# Patient Record
Sex: Female | Born: 1950 | Race: White | Hispanic: No | Marital: Married | Smoking: Former smoker
Health system: Southern US, Community
[De-identification: ages and names within clinical notes are randomized; demographics above are authoritative.]

## PROBLEM LIST (undated history)

## (undated) DIAGNOSIS — M1990 Unspecified osteoarthritis, unspecified site: Secondary | ICD-10-CM

## (undated) DIAGNOSIS — K648 Other hemorrhoids: Secondary | ICD-10-CM

## (undated) DIAGNOSIS — G479 Sleep disorder, unspecified: Secondary | ICD-10-CM

## (undated) DIAGNOSIS — I10 Essential (primary) hypertension: Secondary | ICD-10-CM

## (undated) DIAGNOSIS — Z9889 Other specified postprocedural states: Secondary | ICD-10-CM

## (undated) DIAGNOSIS — E785 Hyperlipidemia, unspecified: Secondary | ICD-10-CM

## (undated) DIAGNOSIS — K589 Irritable bowel syndrome without diarrhea: Secondary | ICD-10-CM

## (undated) DIAGNOSIS — R203 Hyperesthesia: Secondary | ICD-10-CM

## (undated) DIAGNOSIS — T7840XA Allergy, unspecified, initial encounter: Secondary | ICD-10-CM

## (undated) DIAGNOSIS — K5790 Diverticulosis of intestine, part unspecified, without perforation or abscess without bleeding: Secondary | ICD-10-CM

## (undated) DIAGNOSIS — F329 Major depressive disorder, single episode, unspecified: Secondary | ICD-10-CM

## (undated) DIAGNOSIS — F32A Depression, unspecified: Secondary | ICD-10-CM

## (undated) DIAGNOSIS — K219 Gastro-esophageal reflux disease without esophagitis: Secondary | ICD-10-CM

## (undated) DIAGNOSIS — E039 Hypothyroidism, unspecified: Secondary | ICD-10-CM

## (undated) DIAGNOSIS — Z9109 Other allergy status, other than to drugs and biological substances: Secondary | ICD-10-CM

## (undated) DIAGNOSIS — L290 Pruritus ani: Secondary | ICD-10-CM

## (undated) DIAGNOSIS — R112 Nausea with vomiting, unspecified: Secondary | ICD-10-CM

## (undated) HISTORY — PX: TOTAL HIP ARTHROPLASTY: SHX124

## (undated) HISTORY — PX: POLYPECTOMY: SHX149

## (undated) HISTORY — DX: Pruritus ani: L29.0

## (undated) HISTORY — DX: Hyperlipidemia, unspecified: E78.5

## (undated) HISTORY — DX: Other hemorrhoids: K64.8

## (undated) HISTORY — DX: Diverticulosis of intestine, part unspecified, without perforation or abscess without bleeding: K57.90

## (undated) HISTORY — PX: COLONOSCOPY: SHX174

## (undated) HISTORY — DX: Unspecified osteoarthritis, unspecified site: M19.90

## (undated) HISTORY — DX: Allergy, unspecified, initial encounter: T78.40XA

---

## 1997-12-05 ENCOUNTER — Other Ambulatory Visit: Admission: RE | Admit: 1997-12-05 | Discharge: 1997-12-05 | Payer: Self-pay | Admitting: *Deleted

## 1999-01-14 ENCOUNTER — Other Ambulatory Visit: Admission: RE | Admit: 1999-01-14 | Discharge: 1999-01-14 | Payer: Self-pay | Admitting: *Deleted

## 1999-02-14 ENCOUNTER — Other Ambulatory Visit: Admission: RE | Admit: 1999-02-14 | Discharge: 1999-02-14 | Payer: Self-pay | Admitting: *Deleted

## 2000-01-07 ENCOUNTER — Other Ambulatory Visit: Admission: RE | Admit: 2000-01-07 | Discharge: 2000-01-07 | Payer: Self-pay | Admitting: *Deleted

## 2000-02-04 ENCOUNTER — Encounter: Admission: RE | Admit: 2000-02-04 | Discharge: 2000-02-04 | Payer: Self-pay | Admitting: Internal Medicine

## 2000-02-04 ENCOUNTER — Encounter: Payer: Self-pay | Admitting: Internal Medicine

## 2000-02-18 ENCOUNTER — Ambulatory Visit (HOSPITAL_COMMUNITY): Admission: RE | Admit: 2000-02-18 | Discharge: 2000-02-18 | Payer: Self-pay | Admitting: Gastroenterology

## 2000-02-18 ENCOUNTER — Encounter (INDEPENDENT_AMBULATORY_CARE_PROVIDER_SITE_OTHER): Payer: Self-pay | Admitting: *Deleted

## 2001-01-12 ENCOUNTER — Other Ambulatory Visit: Admission: RE | Admit: 2001-01-12 | Discharge: 2001-01-12 | Payer: Self-pay | Admitting: *Deleted

## 2005-04-30 ENCOUNTER — Ambulatory Visit (HOSPITAL_COMMUNITY): Admission: RE | Admit: 2005-04-30 | Discharge: 2005-04-30 | Payer: Self-pay | Admitting: Gastroenterology

## 2006-03-04 ENCOUNTER — Encounter: Admission: RE | Admit: 2006-03-04 | Discharge: 2006-03-04 | Payer: Self-pay | Admitting: Orthopedic Surgery

## 2006-08-11 HISTORY — PX: TOTAL HIP ARTHROPLASTY: SHX124

## 2006-08-13 ENCOUNTER — Inpatient Hospital Stay (HOSPITAL_COMMUNITY): Admission: RE | Admit: 2006-08-13 | Discharge: 2006-08-16 | Payer: Self-pay | Admitting: Orthopedic Surgery

## 2010-08-15 ENCOUNTER — Encounter
Admission: RE | Admit: 2010-08-15 | Discharge: 2010-08-15 | Payer: Self-pay | Source: Home / Self Care | Attending: Internal Medicine | Admitting: Internal Medicine

## 2010-11-10 HISTORY — PX: NASAL SEPTUM SURGERY: SHX37

## 2010-12-27 NOTE — Discharge Summary (Signed)
NAMEORNELLA, CODERRE NO.:  [id number]   MEDICAL RECORD NO.:  [id number]          PATIENT TYPE:  INP   LOCATION:  1503                         FACILITY:  [facility]   PHYSICIAN:  Madlyn Frankel. Charlann Boxer, M.D.  DATE OF BIRTH:  [birth year]   DATE OF ADMISSION:  08/13/2006  DATE OF DISCHARGE:  08/16/2006                               DISCHARGE SUMMARY   ADMITTING DIAGNOSES:  1. Osteoarthritis.  2. Hypertension.  3. Hypothyroidism.  4. Irritable bowel syndrome.   DISCHARGE DIAGNOSIS:  1. Osteoarthritis.  2. Hypertension.  3. Hypothyroidism.  4. Irritable bowel syndrome.   CONSULTS:  None.   PROCEDURE:  Left total hip arthroplasty.  Surgeon was Dr. Durene Romans.  Assistant was [employer].  Components were a size 50 cup with the Trilock  size 10 with a size 45 be any femoral implant ESR and a 12/14 taper  sleeve on -1 adapter.   PREADMISSION LABS:  Preadmission CBC showed hemoglobin 13.4, hematocrit  38.0, tracked her course of stay on a daily basis starting on August 14, 2006 with hemoglobin of 10.5, hematocrit 30.2.  On August 15, 2006, his  white cell was 11.4, hemoglobin 10.6, hematocrit 29.6, hemodynamically  stable.  Preadmission white cell differential within normal limits.  Preadmission coagulation within normal limits.  Preadmission routine  chemistry showed sodium 137, potassium 33.9, glucose 109.  Tracked 2  times during the course of the day.  On August 14, 2006, sodium was 133,  potassium 3.5, glucose 121, BUN 5, creatinine 0.39.  On August 15, 2006,  his sodium was 133, potassium 3.4, glucose 134.  Preadmission UA showed  small nitrites, many leukocyte esterase and was treated preoperatively  for a UTI.   ELECTROCARDIOGRAM:  EKG showed normal sinus rhythm preoperatively,  clearance.   CHEST TWO VIEW:  No active cardiopulmonary disease.   PORTABLE PELVIS:  Portable pelvis postoperatively showed a status post  left total replacement without  complications.   HOSPITAL COURSE:  The patient underwent left total hip placement and  tolerated the procedure well.  He was admitted to the orthopedic floor.  Pain was well-controlled throughout the course of stay.  He remained  neuromuscularly and vascularly intact throughout his course of stay.  Hemovac was pulled on postoperative day #1.  The dressing was clean, dry  and intact throughout his course of stay.  Dressing was changed after 24  hours on a daily basis.  He was weightbearing as tolerated with the use  of a rolling walker.  Postoperative day #1, his Hemovac was pulled with  plans for DC on Sunday.  Home health or PT assessment determined he was  home health care PT.  [facility].  OT recommended a  brace, commode and 3-in-1 walker.  Postoperative day #2, he was doing  very well.  Hip incision was dry with dressing change.  Postoperative  day #3, again the patient doing well.  Wound was intact.  No sign of  ooze.  Neuromuscular vascularly intact.  He was to be discharged after  PT in improved condition.  DISCHARGED DISPOSITION:  Discharged home with home health care PT in  stable and improved condition.   DISCHARGE PHYSICAL THERAPY:  He is weightbearing as tolerated with the  use of a rolling walker.  We want to minimize pain, maximize range of  motion, increase muscle strength.  We want to encourage independence and  activities of daily living.  Work on gait retraining, as well as  proprioception.   DISCHARGE WOUND CARE:  The patient may shower by wrapping the incision.  She should keep it dry and redress it on a daily basis with a dry  dressing.   DISCHARGE MEDICATIONS:  1. Multiple supplements including flaxseed oil 1000 mg p.o. b.i.d.;      may restart after Lovenox and aspirin.  2. Glucosamine 1500 mg one p.o. three times daily; may start after      Lovenox and aspirin.  3. Chondroitin 1200 p.o. t.i.d.; same as glucosamine.  4. Calcium  vitamin D 600 mg one p.o. daily.  5. St. John's wort 300 mg one p.o. q.a.m.  6. Milk Thistle 175 mg one p.o. q.a.m.  7. Multivitamin one p.o. q.a.m.  8. Synthroid 200 mcg one p.o. q.a.m.  9. Toprol XL 100 mg one p.o. q.a.m.  10.Moexipril HCTZ 15/25 one p.o. q.a.m.  11.Zyrtec 5/120 mg one p.o. b.i.d.  12.Vytorin 10/40 mg one p.o. q.h.s.  13.Hyoscyamine 0.125 mg one p.o. q.4h. p.r.n. diarrhea.  14.Over-the-counter antioxidant one p.o. q.a.m.  15.Phazyme 180 mg one p.o. t.i.d.  16.Lovenox 40 mg one subcutaneous q.24h. for 11 additional days.  17.Robaxin 500 mg one to two p.o. q.4-6h. p.r.n. muscle spasm.  18.Darvocet-N 100 one to two p.o. q.4-6h. p.r.n. pain.  19.Enteric-coated aspirin 325 one p.o. daily x4 weeks after Lovenox      completed.  20.Colace 100 mg one p.o. b.i.d. p.r.n. constipation.   DISCHARGE FOLLOWUP:  Follow up with Dr. Charlann Boxer, [phone], in 2 weeks for a  wound check.  She may return to our office if her condition worsens.  If  she develops any acute shortness of breath or severe calf pain, please  call emergency service immediately.     ______________________________  Yetta Glassman [address], [place]      Madlyn Frankel. Charlann Boxer, M.D.  Electronically Signed    BLM/MEDQ  D:  09/17/2006  T:  09/17/2006  Job:  [id number]

## 2010-12-27 NOTE — H&P (Signed)
[name], [name] NO.:  [id number]   MEDICAL RECORD NO.:  [id number]           PATIENT TYPE:   LOCATION:                                 FACILITY:   PHYSICIAN:  [name]. [name], M.D.  DATE OF BIRTH:  [birth year]   DATE OF ADMISSION:  08/13/2006  DATE OF DISCHARGE:                              HISTORY & PHYSICAL   PROCEDURE:  Left total hip replacement.   CHIEF COMPLAINT:  Left hip pain.   HISTORY OF PRESENT ILLNESS:  This is a 60 year old female whose had  persistent left hip pain secondary to some OA degenerative changes.  All  conservative measures have failed including over-the-counter anti-  inflammatories, prescription anti-inflammatories and intra-articular  injections. Due to diminished quality of life, she has elected to  proceed forward with a total hip replacement.   PAST MEDICAL HISTORY:  1. Hypertension.  2. IBS.  3. Osteoarthritis.   PAST SURGICAL HISTORY:  None.   FAMILY HISTORY:  Hypertension, arthritis.   SOCIAL HISTORY:  The patient is single.  The primary caregiver will be a  friend.  She lives at a condominium that only has one floor and one  step.   DRUG ALLERGIES:  COMPAZINE, VIOXX, CELEBREX and VICODIN as VICODIN gives  her severe stomach pains.   MEDICATIONS:  1. Synthroid generic 200 mcg 1 p.o. daily.  2. Toprol XL 100 mg 1 p.o. daily.  3. Moexipril HCTZ 15/25 one p.o. daily.  4. Zyrtec D 5/120 mg 1 p.o. daily.  5. Vytorin 10/40 mg tabs 1 p.o. daily.  6. Hyoscyamine 0.125 mg 1 p.o. q.4h p.r.n. for diarrhea.   OVER-THE-COUNTER MEDICATIONS:  1. Flax seed oil.  2. Glucosamine chondroitin 1500/1200 mg.  3. Calcium 600 mg.  4. St. John's wort 300 mg.  5. Milk thistle.  6. Multivitamin.   The patient states that she can tolerate Darvon pain medicines as she  has taken those in the past.   REVIEW OF SYSTEMS:  In the past 2 weeks she has had no new signs or  symptoms of any cardiovascular, respiratory,  gastrointestinal,  genitourinary, neurologic and musculoskeletal system complaints.   PHYSICAL EXAM:  VITAL SIGNS:  Temperature 98.2, pulse 88, respirations  18, blood pressure 126/68.  GENERAL:  She is an awake, alert and oriented, well-developed, well-  nourished in no acute distress.  NECK:  No carotid bruits noted.  CHEST/LUNGS:  Clear to auscultation bilaterally.  BREASTS:  Deferred.  HEART:  Regular rate and rhythm without gallops, clicks, rubs or  murmurs.  ABDOMEN:  Soft, nontender, nondistended.  Bowel sounds x4.  GENITOURINARY:  Deferred.  EXTREMITIES:  Left leg is painful and has limited range of motion left  hip.  SKIN:  No signs of cellulitis.  Skin pink and warm.  NEUROLOGIC:  She has intact distal sensibilities.   LABS:  Pending her [name] presurgical appointment.   X-RAYS:  Chest x-ray are pending the [name] presurgical  appointment.  Pelvis x-rays were reviewed which show advanced  degenerative changes of her left hip.   IMPRESSION:  She has  left hip degenerative changes.   PLAN:  Left total hip replacement on August 13, 2006 by surgeon Dr.  Durene Mcknight.  The risks and complications were discussed.  Questions  were encouraged, answered and reviewed. We look forward to treating this  very active and motivated 60 year old female.     ______________________________  [name]. [name], [name]      [name]. [name], M.D.  Electronically Signed    BLM/MEDQ  D:  07/29/2006  T:  07/29/2006  Job:  [id number]

## 2010-12-27 NOTE — Procedures (Signed)
[facility]  Patient:    [name], [name]                       MRN: [id number] Proc. Date: 02/18/00 Adm. Date:  [id number] Attending:  Charna Elizabeth                           Procedure Report  DATE OF BIRTH:  1951/04/22.  REFERRING PHYSICIAN:  Warrick Parisian, M.D.  PROCEDURE PERFORMED:  Colonoscopy with biopsies.  ENDOSCOPIST:  Anselmo Rod, M.D.  INSTRUMENT USED:  Olympus video colonoscope.  INDICATIONS FOR PROCEDURE:  Rectal bleeding in a 60 year old white female rule out colonic polyps, masses, hemorrhoids, etc.  PREPROCEDURE PREPARATION:  Informed consent was procured from the patient. The patient was fasted for eight hours prior to the procedure and prepped with a bottle of magnesium citrate and a gallon of NuLytely the night prior to the procedure.  PREPROCEDURE PHYSICAL:  The patient had stable vital signs.  Neck supple. Chest clear to auscultation.  S1, S2 regular.  Abdomen soft with normal abdominal bowel sounds.  DESCRIPTION OF PROCEDURE:  The patient was placed in the left lateral decubitus position and sedated with 75 mg of Demerol and 7 mg of Versed intravenously.  Once the patient was adequately sedated and maintained on low-flow oxygen and continuous cardiac monitoring, the Olympus video colonoscope was advanced from the rectum to the cecum without difficulty. Except for small internal hemorrhoids on retroflexion in the rectum, no other abnormalities were seen in the colon.  No masses, polyps, erosions, ulcerations or diverticula were present.  There was mild erythematous change seen in the terminal ileum that was biopsied for pathology.  No frank ulceration was seen in the terminal ileum.  The patient tolerated the procedure well without complications.  The entire colonic mucosa in the cecum, right colon, transverse colon and left colon appeared normal.  IMPRESSION: 1. Small nonbleeding internal  hemorrhoid. 2. Mild erythematous changes in the terminal ileum, biopsied to rule out    ileitis. 3. No masses, polyps, erosions, ulcerations or diverticula were seen.  RECOMMENDATIONS: 1. Await pathology results. 2. Avoid nonsteroidals. 3. Outpatient follow-up in the next two weeks for further recommendations. DD:  02/18/00 TD:  02/19/00 Job: [id number]

## 2010-12-27 NOTE — Op Note (Signed)
NAMETORRIE, NAMBA                ACCOUNT NO.:  [id number]   MEDICAL RECORD NO.:  [id number]          PATIENT TYPE:  AMB   LOCATION:  ENDO                         FACILITY:  [facility]   PHYSICIAN:  Anselmo Rod, M.D.  DATE OF BIRTH:  1951/05/04   DATE OF PROCEDURE:  04/30/2005  DATE OF DISCHARGE:                                 OPERATIVE REPORT   PROCEDURE PERFORMED:  Screening colonoscopy.   ENDOSCOPIST:  Charna Elizabeth, M.D.   INSTRUMENT USED:  Olympus video colonoscope.   INDICATIONS FOR PROCEDURE:  A 60 year old white female with a history of  occasional bright red blood per rectum and irritable bowel syndrome  undergoing screening colonoscopy to rule out colonic polyps, masses, etc.   PREPROCEDURE PREPARATION:  Informed consent was procured from the patient.  The patient was fasted for eight hours prior to the procedure and prepped  with a bottle of magnesium citrate and a gallon of GoLYTELY the night prior  to the procedure.  The risks and benefits of the procedure including a 10%  miss rate for colon polyps or cancers was discussed with the patient as  well.   PREPROCEDURE PHYSICAL:  The patient had stable vital signs.  Neck supple.  Chest clear to auscultation.  S1 and S2 regular.  Abdomen soft with normal  bowel sounds.   DESCRIPTION OF PROCEDURE:  The patient was placed in the right lateral  decubitus position as she had some difficulty lying on her left hip.  Once  she was adequately sedated with 80 mg of Demerol and 6 mg of Versed in slow  incremental doses, the Olympus video colonoscope was advanced from the  rectum to the cecum.  The appendicular orifice and ileocecal valve were  clearly visualized and photographed.  A couple of diverticula were seen in  the left colon.  Small internal hemorrhoids were appreciated on retroflexion  in the rectum.  The rest of the exam was unremarkable.  There was some  residual stool in the colon and multiple washes were done.  The  patient  tolerated the procedure well without complication.   IMPRESSION:  1.  Small nonbleeding internal hemorrhoid.  2.  Couple of diverticula in the left colon.  3.  Normal-appearing transverse colon, right colon and cecum.   RECOMMENDATIONS:  1.  Continue high fiber diet with liberal fluid intake.  2.  Repeat colonoscopy is recommended in the next 10 years unless the      patient develops any abnormal symptoms in the interim.  3.  Anusol suppositories if needed for the hemorrhoids in the future.      Anselmo Rod, M.D.  Electronically Signed     JNM/MEDQ  D:  04/30/2005  T:  04/30/2005  Job:  [id number]   cc:   Merlene Laughter. Renae Gloss, M.D.  Fax: [phone]

## 2010-12-27 NOTE — Op Note (Signed)
NAMEDALLIS, DARDEN                ACCOUNT NO.:  [id number]   MEDICAL RECORD NO.:  [id number]          PATIENT TYPE:  INP   LOCATION:  0001                         FACILITY:  [facility] [place] [facility]   PHYSICIAN:  Madlyn Frankel. Charlann Boxer, M.D.  DATE OF BIRTH:  05-Aug-1951   DATE OF PROCEDURE:  08/13/2006  DATE OF DISCHARGE:                               OPERATIVE REPORT   PREOPERATIVE DIAGNOSIS:  Left hip end stage osteoarthritis.   POSTOPERATIVE DIAGNOSIS:  Left hip end stage osteoarthritis.   PROCEDURE PERFORMED:  Left total hip replacement.   COMPONENTS:  DePuy hip system with a size 50 ASR cup, a Trilock size 10  lateralized offset stem with a 45 ASR ball and -1 adapter.   SURGEON:  Durene Romans, M.D.   ASSISTANT:  Lenise Herald, PA-C.   ANESTHESIA:  Spinal plus MAC.   DRAINS:  One.   ESTIMATED BLOOD LOSS:  Less than 400 cc.   COMPLICATIONS:  None.   INDICATIONS FOR PROCEDURE:  The patient is a very pleasant 60 year old  female who presented to the office for persistent left hip pain.  She  had tried conservative measures which failed to provide adequate relief.  She is having decreased quality of life, unable to do the things she  wishes to do.  We thus discussed surgical options in terms of hip  replacement.  The risks and benefits of infection, DVT, dislocation,  component failure, and need for revision surgery were all reviewed.  I  also discussed bearing surfaces and the current technology and head  sizes available to prevent dislocation and provide increased longevity  of the components, and for metal on metal hip replacement.   PROCEDURE IN DETAIL:  The patient was brought to the operative theater.  Once adequate anesthesia and preoperative antibiotics, 2 gm of Ancef,  were administered, the patient was positioned in the right lateral  decubitus position with the left side up, which had been identified  preoperatively.  The left lower extremity was prescrubbed and prepped  and draped  in a sterile fashion.  A lateral-based incision was made for  posterior approach to the hip.  The patient was noted to have a very  tight anatomy.  Preoperatively, I identified the positions of her legs  in a neutral position on the table for comparison postop.   Examination prior to incision also noted very tight tissues,  particularly over the iliotibial band, even in the lateral position she  wanted to somewhat bounce with adduction indicating tight lateral  structures.   Exposure was obtained routinely with an incision through the iliotibial  band and gluteus fascia in line with the incision.  At this time, I also  noted the gluteus maximus to be pretty tight as well as the iliotibial  band.  No extra releases were carried out.  The short external rotators  were taken done, severed from the posterior capsule, and an L  capsulotomy was made.  The hip was dislocated.  The posterior leaf of  the capsule was saved for later anatomic repair as well as protection  against the sciatic nerve  from the retractors.   Following neck osteotomy, the center of the head was identified  visually.  I then made a measure of the cut of 37 mm down onto her  femoral neck to compensate for the lateralized offset component used.   At this point, attention was directed to the femur, retractors were  placed.  A box osteotome was used to set anteversion.  I then hand  reamed to open up the canal and then irrigated to prevent fat emboli.  I  then began broaching with a 6.3 and carried it all the way up to a size  10.   I noted preoperatively that her head center was above her trochanter on  both hips, and for that reason we made our neck cut accordingly and also  set our broaching to that as well.   Following this preparation of the femur, attention was directed to the  acetabulum, acetabular exposure was obtained including placement of  retractors.  She had a small acetabulum.  I began reaming with a  43  reamer down to the medial wall and carried this up to a 49 reamer.  At  2 I chose to do it by 1 mm increments.  This was needed due to the  fairly sclerotic subchondral bone.  I was able to get this penetrated.   Her posterior wall and column remained intact.  I identified the level  of reaming.  I then took the final 50 ASR cup and impacted it down to  the level of the reaming.  I was very happy with the purchase and  position of the cup which was about 35 to 40 degrees of abduction and 20  degrees of forward flexion.   Please note that there was noted to be 3 fairly large subchondral cysts  that were curetted out and then bone grafted.  This was done with  primary bone graft from the femoral head and neck.   Following this, attention was redirected back to the femur where I  impacted the size 10 broach.  I did not feel that it went all the way  down to my neck cut initially, so I went ahead and rebroached with 8 and  then countersunk the 10 down to the level of the neck cut.  I was happy  with this level.  A trial reduction was carried out with the lateralized  offset neck and -1 adapter.   At this point, trial reduction revealed that the hip was very stable  with a combined anteversion of 45 degrees.  There was 1 mm of shuck but  again noting the extensively tight tissues preoperatively this was okay.  With the legs in the neutral position again with the knees parallel to  one another, the leg lengths appeared to be equal, if anything a little  shorter on the left side.  She remained very tight with regard to the  lateral structures but was not very tight in extension.  Given this, the  final components were brought to the field.  The trial components were  thus removed and the final 10 lateralized offset stem was impacted down  to the level where the broach was.  I did go ahead and repeat my trial just to make sure and was happy with the -1 adapter and the 45 ASR ball.  This  was assembled on the back table and then impacted onto a clean and  dried trunion.  Visually, the head center was  above the trochanter as we  anticipated and thus corresponding to the leg length.  The hip was then  reduced, irrigated.  Hemostasis obtained as needed on the way up.  The  posterior capsule leaflet was reapproximated to the superior leaflet  using a #1 Ethibond.  The iliotibial band was reapproximated with #1  Ethilon.  At the apex of the incision, I did make a little bit of a  release into the iliotibial band to try to loosen it up a little bit.  The remainder of the gluteus fascia was  reapproximated using #1 Vicryl.  The remainder was closed with 2-0  Vicryl and a running 4-0 Monocryl in the skin.  The hip was then  cleaned, dried, and dressed sterilely with Steri-Strips, dressing  sponges, tape, and brought to the recovery room stable without  complication.      Madlyn Frankel Charlann Boxer, M.D.  Electronically Signed     MDO/MEDQ  D:  08/13/2006  T:  08/13/2006  Job:  [id number]

## 2011-01-09 ENCOUNTER — Other Ambulatory Visit: Payer: Self-pay | Admitting: Obstetrics and Gynecology

## 2011-07-31 ENCOUNTER — Encounter (HOSPITAL_COMMUNITY): Payer: Self-pay | Admitting: Pharmacy Technician

## 2011-08-06 NOTE — Progress Notes (Signed)
H&P dictated today for 08/18/2011 surgery. Dictation # [id number] 

## 2011-08-06 NOTE — H&P (Signed)
NAMELONISHA, BOBBY NO.:  [id number]  MEDICAL RECORD NO.:  [id number]  LOCATION:  PERIO                        FACILITY:  [facility]  PHYSICIAN:  Madlyn Frankel. Charlann Boxer, M.D.  DATE OF BIRTH:  [birth year]  DATE OF ADMISSION:  08/18/2011 DATE OF DISCHARGE:                             HISTORY & PHYSICAL   DATE OF SURGERY:  August 17, 2010.  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  Despite physical therapy and conservative measures, she continues to have pain in the hip and is now scheduled for revision of her acetabular component and ball and liner.  The surgery, risks, benefits, and aftercare were discussed in detail with the patient. Questions invited and answered.  Surgery to go ahead as scheduled.  Note that she does have a lot of problem with nausea and vomiting with codeine, oxycodone, hydrocodone.  So we will try with Dilaudid p.o. postoperatively and then maybe home with tramadol as she did well with Darvocet postop last time, but that is no longer available.  We may just use tramadol postoperatively for pain depending on her symptoms.  MEDICAL DOCTOR:  Merlene Laughter. Renae Gloss, M.D.  CARDIOLOGIST:  Richard A. Alanda Amass, M.D.  ENT:  Hermelinda Medicus, M.D.  She is given her home medicines of her Aspirin, Robaxin, iron, MiraLax, and Colace.  She is a candidate for tranexamic acid and will receive that preoperatively.  ALLERGIES: 1. Drug allergy to COMPAZINE, HYDROCODONE, and OXYCODONE. 2. 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  CURRENT MEDICATIONS: 1. L-thyroxine 150 mcg 1 daily. 2. Omeprazole 20 mg 1 daily. 3. Veramyst nasal spray 27.5 mcg b.i.d. 4. Moexipril hydrochlorothiazide 15/25 one-half tablet once a day. 5. Metoprolol ER 50 mg 1 tablet q.a.m., half  tablet q.p.m. 6. Crestor 20 mg nightly. 7. Fish oil. 8. Glucosamine. 9. Calcium. 10.Vitamin C. 11.CoQ10. 12.Milk thistle. 13.Vitamin D. 14.Also occasional Zyrtec-D b.i.d. p.r.n.  SERIOUS MEDICAL ILLNESSES:  Include: 1. Hypertension. 2. Hypothyroidism. 3. Reflux. 4. Irritable bowel syndrome. 5. Hyperlipidemia. 6. Allergies.  PREVIOUS SURGERIES:  Include total hip arthroplasty on the left and deviated septum surgery.  FAMILY HISTORY:  Positive for Parkinson's, pneumonia Alzheimer's, and breast cancer.  SOCIAL HISTORY:  The patient is single.  She lives at home.  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  She does not smoke and has 1 to 2 glasses of wine per day.  She is planning on going home after surgery.  REVIEW OF SYSTEMS:  CENTRAL NERVOUS SYSTEMS:  Negative for headache, blurred vision, or dizziness.  PULMONARY:  Negative for shortness of breath, PND, and orthopnea.  Positive for allergies.  CARDIOVASCULAR: Positive for hypertension, hyperlipidemia.  Negative for chest pain or palpitation.  GI:  Positive for reflux, irritable bowel syndrome, and history of hemorrhoids.  Negative for ulcers or hepatitis.  GU: Negative for urinary tract difficulty.  MUSCULOSKELETAL:  Positive in HPI.  PHYSICAL EXAMINATION:  VITAL SIGNS:  BP 128/80, pulse 77 and regular, respirations 16.  HEENT:  Head, normocephalic.  Nose patent.  Ears patent.  Pupils equal, round, reactive to light.  Throat without injection. NECK:  Supple without adenopathy.  Carotids 2+ without bruit. CHEST:  Clear to auscultation.  No rales or rhonchi.  Respirations 16. HEART:  Regular rate and rhythm at 77 beats per minute without murmur. ABDOMEN:  Soft.  Active bowel sounds.  No masses or organomegaly. NEUROLOGIC:  The patient is alert and oriented to time, place, and person.  Cranial nerves II through XII grossly intact. EXTREMITIES:  Shows the left hip with slightly decreased range of motion, with pain in  the anterior lateral aspect of the hip with all range of motion.  Neurovascular status intact.  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  PLAN:  Revision, acetabular component, ball and liner.     Jaquelyn Bitter. Rolla Kedzierski, P.A.   ______________________________ Madlyn Frankel Charlann Boxer, M.D.    SJC/MEDQ  D:  08/06/2011  T:  08/06/2011  Job:  [id number]

## 2011-08-07 ENCOUNTER — Encounter (HOSPITAL_COMMUNITY)
Admission: RE | Admit: 2011-08-07 | Discharge: 2011-08-07 | Disposition: A | Discharge status: Home / Self Care | Payer: BC Managed Care – PPO | Source: Ambulatory Visit | Attending: Orthopedic Surgery | Admitting: Orthopedic Surgery

## 2011-08-07 ENCOUNTER — Encounter (HOSPITAL_COMMUNITY): Payer: Self-pay

## 2011-08-07 HISTORY — DX: Hyperesthesia: R20.3

## 2011-08-07 HISTORY — DX: Hypothyroidism, unspecified: E03.9

## 2011-08-07 HISTORY — DX: Essential (primary) hypertension: I10

## 2011-08-07 HISTORY — DX: Other specified postprocedural states: R11.2

## 2011-08-07 HISTORY — DX: Major depressive disorder, single episode, unspecified: F32.9

## 2011-08-07 HISTORY — DX: Depression, unspecified: F32.A

## 2011-08-07 HISTORY — DX: Other specified postprocedural states: Z98.890

## 2011-08-07 HISTORY — DX: Sleep disorder, unspecified: G47.9

## 2011-08-07 HISTORY — DX: Other allergy status, other than to drugs and biological substances: Z91.09

## 2011-08-07 HISTORY — DX: Gastro-esophageal reflux disease without esophagitis: K21.9

## 2011-08-07 HISTORY — DX: Irritable bowel syndrome, unspecified: K58.9

## 2011-08-07 LAB — URINALYSIS, ROUTINE W REFLEX MICROSCOPIC
Bilirubin Urine: NEGATIVE
Hgb urine dipstick: NEGATIVE
Ketones, ur: NEGATIVE mg/dL
Nitrite: NEGATIVE
Protein, ur: NEGATIVE mg/dL
Specific Gravity, Urine: 1.009 (ref 1.005–1.030)
Urobilinogen, UA: 0.2 mg/dL (ref 0.0–1.0)

## 2011-08-07 LAB — URINE MICROSCOPIC-ADD ON

## 2011-08-07 LAB — COMPREHENSIVE METABOLIC PANEL
ALT: 22 U/L (ref 0–35)
Alkaline Phosphatase: 68 U/L (ref 39–117)
BUN: 10 mg/dL (ref 6–23)
CO2: 28 mEq/L (ref 19–32)
Calcium: 9.8 mg/dL (ref 8.4–10.5)
GFR calc Af Amer: >90 mL/min (ref >90)
GFR calc non Af Amer: >90 mL/min (ref >90)
Glucose, Bld: 120 mg/dL — ABNORMAL HIGH (ref 70–99)
Total Protein: 7.3 g/dL (ref 6.0–8.3)

## 2011-08-07 LAB — PROTIME-INR
INR: 1 (ref 0.00–1.49)
Prothrombin Time: 13.4 seconds (ref 11.6–15.2)

## 2011-08-07 LAB — DIFFERENTIAL
Basophils Absolute: 0.1 10*3/uL (ref 0.0–0.1)
Eosinophils Absolute: 0.3 10*3/uL (ref 0.0–0.7)
Eosinophils Relative: 3 % (ref 0–5)
Lymphocytes Relative: 24 % (ref 12–46)
Lymphs Abs: 2.3 10*3/uL (ref 0.7–4.0)
Neutrophils Relative %: 64 % (ref 43–77)

## 2011-08-07 LAB — CBC
HCT: 40.6 % (ref 36.0–46.0)
Hemoglobin: 13.5 g/dL (ref 12.0–15.0)
MCH: 29.5 pg (ref 26.0–34.0)
MCHC: 33.3 g/dL (ref 30.0–36.0)
RBC: 4.58 MIL/uL (ref 3.87–5.11)

## 2011-08-07 MED ORDER — CHLORHEXIDINE GLUCONATE 4 % EX LIQD: 60.0000 mL | Freq: Once | CUTANEOUS | Status: DC

## 2011-08-07 NOTE — Patient Instructions (Addendum)
20 LURINE IMEL  08/07/2011   Your procedure is scheduled on:  08/18/11  Report to [facility] at 5:30 AM.  Call this number if you have problems the morning of surgery: [phone]   Remember:   Do not eat food:After Midnight.  May have clear liquids:until Midnight .  Clear liquids include soda, tea, black coffee, apple or grape juice, broth.  Take these medicines the morning of surgery with A SIP OF WATER: LEVOTHROID / TOPROL / OMEPRAZOLE   Do not wear jewelry, make-up or nail polish.  Do not wear lotions, powders, or perfumes. You may wear deodorant.  Do not shave 48 hours prior to surgery.  Do not bring valuables to the hospital.  Contacts, dentures or bridgework may not be worn into surgery.  Leave suitcase in the car. After surgery it may be brought to your room.  For patients admitted to the hospital, checkout time is 11:00 AM the day of discharge.   Patients discharged the day of surgery will not be allowed to drive home.  Name and phone number of your driver:   Special Instructions: CHG Shower Use Special Wash: 1/2 bottle night before surgery and 1/2 bottle morning of surgery.   Please read over the following fact sheets that you were given: MRSA Information

## 2011-08-18 ENCOUNTER — Inpatient Hospital Stay (HOSPITAL_COMMUNITY): Payer: BC Managed Care – PPO

## 2011-08-18 ENCOUNTER — Encounter (HOSPITAL_COMMUNITY): Payer: Self-pay | Admitting: Certified Registered Nurse Anesthetist

## 2011-08-18 ENCOUNTER — Inpatient Hospital Stay (HOSPITAL_COMMUNITY): Payer: BC Managed Care – PPO | Admitting: Certified Registered Nurse Anesthetist

## 2011-08-18 ENCOUNTER — Encounter (HOSPITAL_COMMUNITY): Payer: Self-pay | Admitting: *Deleted

## 2011-08-18 ENCOUNTER — Encounter (HOSPITAL_COMMUNITY)
Admission: RE | Disposition: A | Discharge status: Home Health | Payer: Self-pay | Source: Ambulatory Visit | Attending: Orthopedic Surgery

## 2011-08-18 ENCOUNTER — Other Ambulatory Visit: Payer: Self-pay | Admitting: Orthopedic Surgery

## 2011-08-18 ENCOUNTER — Inpatient Hospital Stay (HOSPITAL_COMMUNITY)
Admission: RE | Admit: 2011-08-18 | Discharge: 2011-08-20 | DRG: 817 | Disposition: A | Discharge status: Home Health | Payer: BC Managed Care – PPO | Source: Ambulatory Visit | Attending: Orthopedic Surgery | Admitting: Orthopedic Surgery

## 2011-08-18 DIAGNOSIS — K219 Gastro-esophageal reflux disease without esophagitis: Secondary | ICD-10-CM | POA: Diagnosis present

## 2011-08-18 DIAGNOSIS — E039 Hypothyroidism, unspecified: Secondary | ICD-10-CM | POA: Diagnosis present

## 2011-08-18 DIAGNOSIS — Z96649 Presence of unspecified artificial hip joint: Secondary | ICD-10-CM

## 2011-08-18 DIAGNOSIS — Y831 Surgical operation with implant of artificial internal device as the cause of abnormal reaction of the patient, or of later complication, without mention of misadventure at the time of the procedure: Secondary | ICD-10-CM | POA: Diagnosis present

## 2011-08-18 DIAGNOSIS — I10 Essential (primary) hypertension: Secondary | ICD-10-CM | POA: Diagnosis present

## 2011-08-18 DIAGNOSIS — Z01812 Encounter for preprocedural laboratory examination: Secondary | ICD-10-CM | POA: Diagnosis present

## 2011-08-18 DIAGNOSIS — E785 Hyperlipidemia, unspecified: Secondary | ICD-10-CM | POA: Diagnosis present

## 2011-08-18 DIAGNOSIS — T84099A Other mechanical complication of unspecified internal joint prosthesis, initial encounter: Principal | ICD-10-CM | POA: Diagnosis present

## 2011-08-18 HISTORY — PX: TOTAL HIP REVISION: SHX763

## 2011-08-18 LAB — TYPE AND SCREEN: Antibody Screen: NEGATIVE

## 2011-08-18 SURGERY — TOTAL HIP REVISION
Anesthesia: Spinal | Site: Hip | Laterality: Left | Wound class: Clean

## 2011-08-18 MED ORDER — RIVAROXABAN 10 MG PO TABS
10.0000 mg | ORAL_TABLET | Freq: Every day | ORAL | Status: DC
  Filled 2011-08-18: qty 1

## 2011-08-18 MED ORDER — ONDANSETRON HCL 4 MG/2ML IJ SOLN: 4.0000 mg | Freq: Four times a day (QID) | INTRAMUSCULAR | Status: DC | PRN

## 2011-08-18 MED ORDER — METOPROLOL SUCCINATE ER 25 MG PO TB24
25.0000 mg | ORAL_TABLET | Freq: Every day | ORAL | Status: DC
  Administered 2011-08-18 – 2011-08-19 (×2): 25 mg via ORAL
  Filled 2011-08-18 (×3): qty 1

## 2011-08-18 MED ORDER — MENTHOL 3 MG MT LOZG
1.0000 | LOZENGE | OROMUCOSAL | Status: DC | PRN
  Filled 2011-08-18: qty 9

## 2011-08-18 MED ORDER — HYOSCYAMINE SULFATE 0.125 MG PO TBDP
0.1250 mg | ORAL_TABLET | Freq: Four times a day (QID) | ORAL | Status: DC | PRN
  Filled 2011-08-18: qty 1

## 2011-08-18 MED ORDER — DOCUSATE SODIUM 100 MG PO CAPS
100.0000 mg | ORAL_CAPSULE | Freq: Two times a day (BID) | ORAL | Status: DC
  Filled 2011-08-18 (×6): qty 1

## 2011-08-18 MED ORDER — METOCLOPRAMIDE HCL 5 MG/ML IJ SOLN: 5.0000 mg | Freq: Three times a day (TID) | INTRAMUSCULAR | Status: DC | PRN

## 2011-08-18 MED ORDER — LORATADINE 10 MG PO TABS
10.0000 mg | ORAL_TABLET | Freq: Every day | ORAL | Status: DC
  Filled 2011-08-18 (×3): qty 1

## 2011-08-18 MED ORDER — PROPOFOL 10 MG/ML IV EMUL
INTRAVENOUS | Status: DC | PRN
  Administered 2011-08-18: 100 ug/kg/min via INTRAVENOUS

## 2011-08-18 MED ORDER — HYDROMORPHONE HCL PF 1 MG/ML IJ SOLN: 0.5000 mg | INTRAMUSCULAR | Status: DC | PRN

## 2011-08-18 MED ORDER — METHOCARBAMOL 100 MG/ML IJ SOLN
500.0000 mg | Freq: Four times a day (QID) | INTRAVENOUS | Status: DC | PRN
  Administered 2011-08-18: 500 mg via INTRAVENOUS
  Filled 2011-08-18: qty 5

## 2011-08-18 MED ORDER — HYDROMORPHONE HCL PF 1 MG/ML IJ SOLN: 0.2500 mg | INTRAMUSCULAR | Status: DC | PRN

## 2011-08-18 MED ORDER — RIVAROXABAN 10 MG PO TABS
10.0000 mg | ORAL_TABLET | Freq: Every day | ORAL | Status: DC
  Administered 2011-08-19 – 2011-08-20 (×2): 10 mg via ORAL
  Filled 2011-08-18 (×2): qty 1

## 2011-08-18 MED ORDER — DEXAMETHASONE SODIUM PHOSPHATE 10 MG/ML IJ SOLN
10.0000 mg | INTRAMUSCULAR | Status: DC
  Filled 2011-08-18: qty 1

## 2011-08-18 MED ORDER — LACTATED RINGERS IV SOLN
INTRAVENOUS | Status: DC | PRN
  Administered 2011-08-18: 07:00:00 via INTRAVENOUS

## 2011-08-18 MED ORDER — TRAMADOL HCL 50 MG PO TABS
50.0000 mg | ORAL_TABLET | Freq: Four times a day (QID) | ORAL | Status: DC
  Administered 2011-08-18: 50 mg via ORAL
  Administered 2011-08-18 – 2011-08-19 (×2): 100 mg via ORAL
  Administered 2011-08-19 (×2): 50 mg via ORAL
  Administered 2011-08-19 – 2011-08-20 (×3): 100 mg via ORAL
  Filled 2011-08-18 (×10): qty 2
  Filled 2011-08-18: qty 1
  Filled 2011-08-18: qty 2

## 2011-08-18 MED ORDER — METOPROLOL SUCCINATE ER 50 MG PO TB24
50.0000 mg | ORAL_TABLET | Freq: Every day | ORAL | Status: DC
  Administered 2011-08-19 – 2011-08-20 (×2): 50 mg via ORAL
  Filled 2011-08-18 (×3): qty 1

## 2011-08-18 MED ORDER — POLYETHYLENE GLYCOL 3350 17 G PO PACK
17.0000 g | PACK | Freq: Two times a day (BID) | ORAL | Status: DC
  Filled 2011-08-18 (×6): qty 1

## 2011-08-18 MED ORDER — METOCLOPRAMIDE HCL 10 MG PO TABS: 5.0000 mg | ORAL_TABLET | Freq: Three times a day (TID) | ORAL | Status: DC | PRN

## 2011-08-18 MED ORDER — MIDAZOLAM HCL 5 MG/5ML IJ SOLN
INTRAMUSCULAR | Status: DC | PRN
  Administered 2011-08-18: 2 mg via INTRAVENOUS

## 2011-08-18 MED ORDER — SODIUM CHLORIDE 0.9 % IV SOLN
100.0000 mL/h | INTRAVENOUS | Status: DC
  Administered 2011-08-18 – 2011-08-19 (×3): 100 mL/h via INTRAVENOUS
  Filled 2011-08-18 (×9): qty 1000

## 2011-08-18 MED ORDER — MOEXIPRIL-HYDROCHLOROTHIAZIDE 15-25 MG PO TABS
0.5000 | ORAL_TABLET | Freq: Every day | ORAL | Status: DC
  Filled 2011-08-18 (×3): qty 1

## 2011-08-18 MED ORDER — SODIUM CHLORIDE 0.9 % IV SOLN
INTRAVENOUS | Status: DC
  Filled 2011-08-18: qty 1000

## 2011-08-18 MED ORDER — PANTOPRAZOLE SODIUM 40 MG PO TBEC
40.0000 mg | DELAYED_RELEASE_TABLET | Freq: Every day | ORAL | Status: DC
  Administered 2011-08-19 – 2011-08-20 (×2): 40 mg via ORAL
  Filled 2011-08-18 (×2): qty 1

## 2011-08-18 MED ORDER — ONDANSETRON HCL 4 MG/2ML IJ SOLN: 4.0000 mg | Freq: Once | INTRAMUSCULAR | Status: DC | PRN

## 2011-08-18 MED ORDER — BISACODYL 5 MG PO TBEC: 5.0000 mg | DELAYED_RELEASE_TABLET | Freq: Every day | ORAL | Status: DC | PRN

## 2011-08-18 MED ORDER — LEVOTHYROXINE SODIUM 150 MCG PO TABS
150.0000 ug | ORAL_TABLET | Freq: Every day | ORAL | Status: DC
  Administered 2011-08-19 – 2011-08-20 (×2): 150 ug via ORAL
  Filled 2011-08-18 (×2): qty 1

## 2011-08-18 MED ORDER — 0.9 % SODIUM CHLORIDE (POUR BTL) OPTIME
TOPICAL | Status: DC | PRN
  Administered 2011-08-18: 1000 mL

## 2011-08-18 MED ORDER — CLOTRIMAZOLE 1 % EX CREA
TOPICAL_CREAM | Freq: Two times a day (BID) | CUTANEOUS | Status: DC | PRN
  Filled 2011-08-18: qty 15

## 2011-08-18 MED ORDER — ACETAMINOPHEN 650 MG RE SUPP: 650.0000 mg | Freq: Four times a day (QID) | RECTAL | Status: DC | PRN

## 2011-08-18 MED ORDER — DIPHENHYDRAMINE HCL 25 MG PO CAPS: 25.0000 mg | ORAL_CAPSULE | Freq: Four times a day (QID) | ORAL | Status: DC | PRN

## 2011-08-18 MED ORDER — ACETAMINOPHEN 10 MG/ML IV SOLN
INTRAVENOUS | Status: DC | PRN
  Administered 2011-08-18: 1000 mg via INTRAVENOUS

## 2011-08-18 MED ORDER — DEXAMETHASONE SODIUM PHOSPHATE 10 MG/ML IJ SOLN
INTRAMUSCULAR | Status: DC | PRN
  Administered 2011-08-18: 10 mg via INTRAVENOUS

## 2011-08-18 MED ORDER — ZOLPIDEM TARTRATE 5 MG PO TABS: 5.0000 mg | ORAL_TABLET | Freq: Every evening | ORAL | Status: DC | PRN

## 2011-08-18 MED ORDER — ACETAMINOPHEN 325 MG PO TABS: 650.0000 mg | ORAL_TABLET | Freq: Four times a day (QID) | ORAL | Status: DC | PRN

## 2011-08-18 MED ORDER — FERROUS SULFATE 325 (65 FE) MG PO TABS
325.0000 mg | ORAL_TABLET | Freq: Three times a day (TID) | ORAL | Status: DC
  Administered 2011-08-19 – 2011-08-20 (×4): 325 mg via ORAL
  Filled 2011-08-18 (×8): qty 1

## 2011-08-18 MED ORDER — ONDANSETRON HCL 4 MG PO TABS: 4.0000 mg | ORAL_TABLET | Freq: Four times a day (QID) | ORAL | Status: DC | PRN

## 2011-08-18 MED ORDER — METOPROLOL SUCCINATE ER 25 MG PO TB24: 25.0000 mg | ORAL_TABLET | Freq: Two times a day (BID) | ORAL | Status: DC

## 2011-08-18 MED ORDER — TRANEXAMIC ACID 100 MG/ML IV SOLN
1300.0000 mg | INTRAVENOUS | Status: AC
  Administered 2011-08-18: 1300 mg via INTRAVENOUS
  Filled 2011-08-18: qty 13

## 2011-08-18 MED ORDER — ALUM & MAG HYDROXIDE-SIMETH 200-200-20 MG/5ML PO SUSP: 30.0000 mL | ORAL | Status: DC | PRN

## 2011-08-18 MED ORDER — ROSUVASTATIN CALCIUM 20 MG PO TABS
20.0000 mg | ORAL_TABLET | Freq: Every evening | ORAL | Status: DC
  Administered 2011-08-18 – 2011-08-19 (×2): 20 mg via ORAL
  Filled 2011-08-18 (×3): qty 1

## 2011-08-18 MED ORDER — BUPIVACAINE HCL 0.75 % IJ SOLN
INTRAMUSCULAR | Status: DC | PRN
  Administered 2011-08-18: 2 mL via INTRATHECAL

## 2011-08-18 MED ORDER — RIVAROXABAN 10 MG PO TABS: 10.0000 mg | ORAL_TABLET | Freq: Every day | ORAL | Status: DC

## 2011-08-18 MED ORDER — CEFAZOLIN SODIUM-DEXTROSE 2-3 GM-% IV SOLR
2.0000 g | Freq: Four times a day (QID) | INTRAVENOUS | Status: AC
  Administered 2011-08-18 – 2011-08-19 (×3): 2 g via INTRAVENOUS
  Filled 2011-08-18 (×3): qty 50

## 2011-08-18 MED ORDER — PHENOL 1.4 % MT LIQD
1.0000 | OROMUCOSAL | Status: DC | PRN
  Filled 2011-08-18: qty 177

## 2011-08-18 MED ORDER — FLEET ENEMA 7-19 GM/118ML RE ENEM: 1.0000 | ENEMA | Freq: Once | RECTAL | Status: AC | PRN

## 2011-08-18 MED ORDER — METHOCARBAMOL 500 MG PO TABS: 500.0000 mg | ORAL_TABLET | Freq: Four times a day (QID) | ORAL | Status: DC | PRN

## 2011-08-18 MED ORDER — CEFAZOLIN SODIUM-DEXTROSE 2-3 GM-% IV SOLR
2.0000 g | Freq: Once | INTRAVENOUS | Status: AC
  Administered 2011-08-18: 2 g via INTRAVENOUS

## 2011-08-18 MED ORDER — DEXAMETHASONE SODIUM PHOSPHATE 10 MG/ML IJ SOLN
10.0000 mg | Freq: Once | INTRAMUSCULAR | Status: AC
  Administered 2011-08-19: 10 mg via INTRAVENOUS
  Filled 2011-08-18: qty 1

## 2011-08-18 MED ORDER — PHENYLEPHRINE HCL 10 MG/ML IJ SOLN
10.0000 mg | INTRAVENOUS | Status: DC | PRN
  Administered 2011-08-18: 20 ug/min via INTRAVENOUS

## 2011-08-18 MED ORDER — EPHEDRINE SULFATE 50 MG/ML IJ SOLN
INTRAMUSCULAR | Status: DC | PRN
  Administered 2011-08-18 (×3): 10 mg via INTRAVENOUS

## 2011-08-18 MED ORDER — ONDANSETRON HCL 4 MG/2ML IJ SOLN
INTRAMUSCULAR | Status: DC | PRN
  Administered 2011-08-18: 4 mg via INTRAVENOUS

## 2011-08-18 MED ORDER — FLUTICASONE FUROATE 27.5 MCG/SPRAY NA SUSP
1.0000 | Freq: Two times a day (BID) | NASAL | Status: DC
  Filled 2011-08-18 (×8): qty 2

## 2011-08-18 SURGICAL SUPPLY — 57 items
BAG ZIPLOCK 12X15 (MISCELLANEOUS) ×2 IMPLANT
BLADE SAW SGTL 18X1.27X75 (BLADE) IMPLANT
BRUSH FEMORAL CANAL (MISCELLANEOUS) IMPLANT
CLOTH BEACON ORANGE TIMEOUT ST (SAFETY) ×2 IMPLANT
DERMABOND ADVANCED (GAUZE/BANDAGES/DRESSINGS) ×1
DERMABOND ADVANCED .7 DNX12 (GAUZE/BANDAGES/DRESSINGS) ×1 IMPLANT
DRAPE INCISE IOBAN 85X60 (DRAPES) ×2 IMPLANT
DRAPE ORTHO SPLIT 77X108 STRL (DRAPES) ×2
DRAPE POUCH INSTRU U-SHP 10X18 (DRAPES) ×2 IMPLANT
DRAPE SURG 17X11 SM STRL (DRAPES) ×2 IMPLANT
DRAPE SURG ORHT 6 SPLT 77X108 (DRAPES) ×2 IMPLANT
DRAPE U-SHAPE 47X51 STRL (DRAPES) ×2 IMPLANT
DRSG AQUACEL AG ADV 3.5X10 (GAUZE/BANDAGES/DRESSINGS) ×2 IMPLANT
DRSG EMULSION OIL 3X16 NADH (GAUZE/BANDAGES/DRESSINGS) IMPLANT
DRSG MEPILEX BORDER 4X4 (GAUZE/BANDAGES/DRESSINGS) IMPLANT
DRSG MEPILEX BORDER 4X8 (GAUZE/BANDAGES/DRESSINGS) IMPLANT
DRSG TEGADERM 4X4.75 (GAUZE/BANDAGES/DRESSINGS) ×2 IMPLANT
DURAPREP 26ML APPLICATOR (WOUND CARE) ×2 IMPLANT
ELECT BLADE TIP CTD 4 INCH (ELECTRODE) ×2 IMPLANT
ELECT REM PT RETURN 9FT ADLT (ELECTROSURGICAL) ×2
ELECTRODE REM PT RTRN 9FT ADLT (ELECTROSURGICAL) ×1 IMPLANT
ELIMINATOR HOLE APEX DEPUY (Hips) ×2 IMPLANT
EVACUATOR 1/8 PVC DRAIN (DRAIN) ×2 IMPLANT
FACESHIELD LNG OPTICON STERILE (SAFETY) ×8 IMPLANT
GAUZE SPONGE 2X2 8PLY STRL LF (GAUZE/BANDAGES/DRESSINGS) ×1 IMPLANT
GLOVE BIOGEL PI IND STRL 7.5 (GLOVE) ×1 IMPLANT
GLOVE BIOGEL PI IND STRL 8 (GLOVE) ×1 IMPLANT
GLOVE BIOGEL PI INDICATOR 7.5 (GLOVE) ×1
GLOVE BIOGEL PI INDICATOR 8 (GLOVE) ×1
GLOVE ORTHO TXT STRL SZ7.5 (GLOVE) ×4 IMPLANT
GLOVE SURG ORTHO 8.0 STRL STRW (GLOVE) ×2 IMPLANT
GOWN STRL NON-REIN LRG LVL3 (GOWN DISPOSABLE) ×2 IMPLANT
HANDPIECE INTERPULSE COAX TIP (DISPOSABLE)
HEAD CER BIO DELTA 36 PLUS 1.5 (Hips) ×2 IMPLANT
KIT BASIN OR (CUSTOM PROCEDURE TRAY) ×2 IMPLANT
LINER NEUTRAL 52X36MM PLUS 4 (Liner) ×2 IMPLANT
MANIFOLD NEPTUNE II (INSTRUMENTS) ×2 IMPLANT
NS IRRIG 1000ML POUR BTL (IV SOLUTION) ×2 IMPLANT
PACK TOTAL JOINT (CUSTOM PROCEDURE TRAY) ×2 IMPLANT
PIN SECTOR W/GRIP ACE CUP 52MM (Hips) ×2 IMPLANT
POSITIONER SURGICAL ARM (MISCELLANEOUS) ×2 IMPLANT
PRESSURIZER FEMORAL UNIV (MISCELLANEOUS) ×2 IMPLANT
SCREW 6.5MMX30MM (Screw) ×2 IMPLANT
SET HNDPC FAN SPRY TIP SCT (DISPOSABLE) IMPLANT
SPONGE GAUZE 2X2 STER 10/PKG (GAUZE/BANDAGES/DRESSINGS) ×1
SPONGE LAP 18X18 X RAY DECT (DISPOSABLE) IMPLANT
SPONGE LAP 4X18 X RAY DECT (DISPOSABLE) IMPLANT
STAPLER VISISTAT 35W (STAPLE) IMPLANT
SUCTION FRAZIER TIP 10 FR DISP (SUCTIONS) ×2 IMPLANT
SUT MNCRL AB 4-0 PS2 18 (SUTURE) ×2 IMPLANT
SUT VIC AB 1 CT1 36 (SUTURE) ×6 IMPLANT
SUT VIC AB 2-0 CT1 27 (SUTURE) ×3
SUT VIC AB 2-0 CT1 TAPERPNT 27 (SUTURE) ×3 IMPLANT
TOWEL OR 17X26 10 PK STRL BLUE (TOWEL DISPOSABLE) ×4 IMPLANT
TOWER CARTRIDGE SMART MIX (DISPOSABLE) IMPLANT
TRAY FOLEY CATH 14FRSI W/METER (CATHETERS) ×2 IMPLANT
WATER STERILE IRR 1500ML POUR (IV SOLUTION) ×2 IMPLANT

## 2011-08-18 NOTE — Interval H&P Note (Signed)
History and Physical Interval Note:  08/18/2011 6:49 AM  Gilman Schmidt  has presented today for surgery, with the diagnosis of Left Total Hip Failure  The various methods of treatment have been discussed with the patient and family. After consideration of risks, benefits and other options for treatment, the patient has consented to  Procedure(s): REVISION LEFT TOTAL HIP REPLACEMENT as a surgical intervention .  The patients' history has been reviewed, patient examined, no change in status, stable for surgery.  I have reviewed the patients' chart and labs.  Questions were answered to the patient's satisfaction.     Shelda Pal

## 2011-08-18 NOTE — Brief Op Note (Signed)
08/18/2011  8:45 AM  PATIENT:  [name]  61 y.o. 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  PROCEDURE:  Procedure(s): REVISION LEFT TOTAL HIP, ACETABULAR COMPONENT AND FEMORAL HEAD  SURGEON:  Surgeon(s): Shelda Pal  PHYSICIAN ASSISTANT: Lanney Gins, PA-C  ANESTHESIA:   spinal  EBL:  Total I/O In: 1000 [I.V.:1000] Out: 250 [Urine:100; Blood:150]  BLOOD ADMINISTERED:none  DRAINS: (1 medium) Hemovact drain(s) in the left hip with  Suction Open   LOCAL MEDICATIONS USED:  NONE  SPECIMEN:  No Specimen  DISPOSITION OF SPECIMEN:  N/A  COUNTS:  YES  TOURNIQUET:  * No tourniquets in log *  DICTATION: .Other Dictation: Dictation Number [id number]  PLAN OF CARE: Admit to inpatient   PATIENT DISPOSITION:  PACU - hemodynamically stable.   Delay start of Pharmacological VTE agent (>24hrs) due to surgical blood loss or risk of bleeding:  {YES/NO/NOT APPLICABLE:20182

## 2011-08-18 NOTE — Transfer of Care (Signed)
Immediate Anesthesia Transfer of Care Note  Patient: [name]  Procedure(s) Performed:  TOTAL HIP REVISION - acetabular cup and liner exchange  Patient Location: PACU  Anesthesia Type: Spinal  Level of Consciousness: sedated, patient cooperative and responds to stimulaton  Airway & Oxygen Therapy: Patient Spontanous Breathing and Patient connected to face mask oxgen  Post-op Assessment: Report given to PACU RN and Post -op Vital signs reviewed and stable  Post vital signs: Reviewed and stable  Complications: No apparent anesthesia complications

## 2011-08-18 NOTE — Anesthesia Preprocedure Evaluation (Signed)
Anesthesia Evaluation  Patient identified by MRN, date of birth, ID band Patient awake    Reviewed: Allergy & Precautions, H&P , NPO status , Patient's Chart, lab work & pertinent test results  History of Anesthesia Complications (+) PONV  Airway Mallampati: II TM Distance: >3 FB Neck ROM: Full    Dental No notable dental hx.    Pulmonary neg pulmonary ROS,  clear to auscultation  Pulmonary exam normal       Cardiovascular hypertension, Pt. on medications and Pt. on home beta blockers neg cardio ROS Regular Normal    Neuro/Psych PSYCHIATRIC DISORDERS Depression Negative Neurological ROS  Negative Psych ROS   GI/Hepatic negative GI ROS, Neg liver ROS, GERD-  Medicated,  Endo/Other  Negative Endocrine ROSHypothyroidism   Renal/GU negative Renal ROS  Genitourinary negative   Musculoskeletal negative musculoskeletal ROS (+)   Abdominal   Peds negative pediatric ROS (+)  Hematology negative hematology ROS (+)   Anesthesia Other Findings   Reproductive/Obstetrics negative OB ROS                           Anesthesia Physical Anesthesia Plan  ASA: II  Anesthesia Plan: Spinal   Post-op Pain Management:    Induction: Intravenous  Airway Management Planned:   Additional Equipment:   Intra-op Plan:   Post-operative Plan: Extubation in OR  Informed Consent: I have reviewed the patients History and Physical, chart, labs and discussed the procedure including the risks, benefits and alternatives for the proposed anesthesia with the patient or authorized representative who has indicated his/her understanding and acceptance.   Dental advisory given  Plan Discussed with: CRNA  Anesthesia Plan Comments: (Discussed risks of spinal including headache, backache, failure, bleedin , infection, nerve damage.)        Anesthesia Quick Evaluation

## 2011-08-18 NOTE — Op Note (Signed)
NAMESYDELLE, SHERFIELD NO.:  [id number]  MEDICAL RECORD NO.:  [id number]  LOCATION:  1606                         FACILITY:  [facility]  PHYSICIAN:  Madlyn Frankel. Charlann Boxer, M.D.  DATE OF BIRTH:  02-08-1951  DATE OF PROCEDURE: DATE OF DISCHARGE:                              OPERATIVE REPORT   PREOPERATIVE DIAGNOSIS:  Failed left hip replacement due to significant metal on metal concerns by the patient.  POSTOPERATIVE DIAGNOSIS:  Failed left hip replacement due to significant metal on metal concerns by the patient.  PROCEDURE:  Revision, left total hip replacement, with new acetabular shell liner and femoral head using DePuy components, size 52 Gription pinnacle shell with a single cancellous screw, a 36 +4 neutral Altrex liner and a 36 +1.5 revision Delta femoral head.  SURGEON:  Madlyn Frankel. Charlann Boxer, M.D.  ASSISTANT:  Lanney Gins, PA  Lanney Gins was present for the entirety of the case from preoperative positioning, perioperative retractor management, general facilitation of the case and primary wound closure.  ANESTHESIA:  Spinal.  SPECIMENS:  The femoral head and cup were sent to Pathology based on current litigation regarding the previously placed DePuy ASR cup for the review prior to sending to the attorneys.  DRAINS:  One Hemovac.  BLOOD LOSS:  About 150 mL.  The patient is stable otherwise.  INDICATIONS FOR PROCEDURE:  Ms. Skilton is a 61 year old female with a history of an indexed left total hip replacement approximately 5 years ago.  She had done exceedingly well.  She had been seen recently in the office with concerns about metal on metal raised from the company and their recall.  Workup revealed that she had normal cobalt and chromium serum values with an MRI that did not depict any significant fluid collection.  Despite reviewing this with her she had a significant amount of concerns of the metal on metal and things that she had read about.  I  reviewed with her the pros of this revision by eliminating these concerns with a ceramic on polyethylene insert.  The concerns that were raised were with the polyethylene bearing insert, that it may wear out with time, in addition to standard risk of infection, DVT, she would be at a higher risk of dislocation in the initial postop period related to her revision setting.  Despite discussions at length regarding this, she wished to proceed. Consent was obtained for the above procedure.  PROCEDURE IN DETAIL:  The patient was brought to the operative theater. Once adequate anesthesia, preop antibiotics, Ancef were administered she was positioned into the right lateral decubitus position left side up. The left lower extremity was then prepped and draped in a sterile fashion following her preoperative scrub.  Time-out was performed identifying the patient, planned procedure, and extremity.  Her previous incision was utilized and extended slightly proximally.  Sharp dissection was carried to the iliotibial band and gluteal fascia, which were then incised for a posterior approach.  The patient's pseudocapsule was taken down as a single unit and utilized to act as a soft tissue barrier to the sciatic nerve, as well as for anatomic boundaries and repair at the end of the case.  After an initial exposure and debridement of the scar tissue we identified very little fluid collection, the fluid that was present was normal synovial fluid.  The soft tissues were elevated off the posterior ilium and then onto the lateral ilium to allow for placement of the trunnion during the acetabular preparation.  The hip was then dislocated.  The femoral head was removed.  The trunnion was then placed onto the ilium and a retractor placed to allow for anterior retraction of the femur.  With the cup exposed following further debridement, we utilized Innomed cup extraction set using the large head appropriate  for the previously placed cup, the acetabular shell was removed with evidence of bony ingrowth.  Please note that neither the trunnion nor the joint itself had any metal stained fluid present.  With the cup removed with very little bone loss at this point based on the use of this explant system, I reamed with a 51 reamer and placed a 52 Gription pinnacle cup.  We had a good initial scratch fit and it appeared to be sitting at about 35-40 degrees of abduction and was anatomically positioned beneath the anterior rim anteriorly.  A single cancellous screw was placed.  A trial 36 +4 neutral liner was used and a trial reduction carried out with this, I felt the combined anteversion was approximately 50 degrees.  There was no evidence of subluxation with forward flexion, internal rotation, or in the sleep position, no evidence of impingement with external rotation and abduction.  Given these findings, the trial component was removed.  A final hole eliminator was placed.  The final 36 +4 neutral Altrex liner was then impacted in position with good visualized rim fit.  The final 36 +1.5 Delta ceramic revision head was then impacted onto a clean and dry trunnion.  The hip was reduced.  The hip had been irrigated throughout the case and again at this point.  At this point, I reapproximated the pseudocapsule to the superior tissue, placed a medium Hemovac drain deep.  The iliotibial band and gluteal fascia were then reapproximated using 1 Vicryl.  The remainder of the wound was closed with 2-0 Vicryl, and then re-closed with 4-0 Monocryl.  The hip was then cleaned, dried and dressed sterilely using Dermabond and Aquacel dressing, the drain site dressed separately.  She was then brought to the recovery room in stable condition tolerating the procedure well.     Madlyn Frankel Charlann Boxer, M.D.    MDO/MEDQ  D:  08/18/2011  T:  08/18/2011  Job:  [id number]

## 2011-08-18 NOTE — Anesthesia Procedure Notes (Addendum)
Spinal  Patient location during procedure: OR Staffing Anesthesiologist: Azell Der Performed by: anesthesiologist  Preanesthetic Checklist Completed: patient identified, site marked, surgical consent, pre-op evaluation, timeout performed, IV checked, risks and benefits discussed and monitors and equipment checked Spinal Block Patient position: sitting Prep: Betadine Patient monitoring: heart rate, continuous pulse ox and blood pressure Location: L2-3 Injection technique: single-shot Needle Needle type: Sprotte  Needle gauge: 24 G Needle length: 9 cm Additional Notes Expiration date of kit checked and confirmed. Patient tolerated procedure well, without complications.

## 2011-08-18 NOTE — H&P (View-Only) (Signed)
H&P dictated today for 08/18/2011 surgery. Dictation # [id number]

## 2011-08-18 NOTE — Anesthesia Postprocedure Evaluation (Signed)
  Anesthesia Post-op Note  Patient: [name]  Procedure(s) Performed:  TOTAL HIP REVISION - acetabular cup and liner exchange  Patient Location: PACU  Anesthesia Type: Spinal  Level of Consciousness: awake and alert   Airway and Oxygen Therapy: Patient Spontanous Breathing  Post-op Pain: mild  Post-op Assessment: Post-op Vital signs reviewed, Patient's Cardiovascular Status Stable, Respiratory Function Stable, Patent Airway and No signs of Nausea or vomiting  Post-op Vital Signs: stable  Complications: No apparent anesthesia complications

## 2011-08-19 ENCOUNTER — Encounter (HOSPITAL_COMMUNITY): Payer: Self-pay | Admitting: Orthopedic Surgery

## 2011-08-19 LAB — CBC
HCT: 33.5 % — ABNORMAL LOW (ref 36.0–46.0)
RBC: 3.77 MIL/uL — ABNORMAL LOW (ref 3.87–5.11)
RDW: 12.8 % (ref 11.5–15.5)
WBC: 15 10*3/uL — ABNORMAL HIGH (ref 4.0–10.5)

## 2011-08-19 LAB — BASIC METABOLIC PANEL
BUN: 11 mg/dL (ref 6–23)
Chloride: 103 mEq/L (ref 96–112)
GFR calc Af Amer: >90 mL/min (ref >90)
Potassium: 3.9 mEq/L (ref 3.5–5.1)

## 2011-08-19 MED ORDER — TRANDOLAPRIL 1 MG PO TABS
1.0000 mg | ORAL_TABLET | Freq: Every day | ORAL | Status: DC
  Administered 2011-08-19 – 2011-08-20 (×2): 1 mg via ORAL
  Filled 2011-08-19 (×2): qty 1

## 2011-08-19 MED ORDER — DIPHENHYDRAMINE HCL 25 MG PO CAPS: 25.0000 mg | ORAL_CAPSULE | Freq: Four times a day (QID) | ORAL | Status: AC | PRN

## 2011-08-19 MED ORDER — ASPIRIN EC 325 MG PO TBEC: 325.0000 mg | DELAYED_RELEASE_TABLET | Freq: Two times a day (BID) | ORAL | Status: AC

## 2011-08-19 MED ORDER — DSS 100 MG PO CAPS: 100.0000 mg | ORAL_CAPSULE | Freq: Two times a day (BID) | ORAL | Status: AC

## 2011-08-19 MED ORDER — POLYETHYLENE GLYCOL 3350 17 G PO PACK: 17.0000 g | PACK | Freq: Two times a day (BID) | ORAL | Status: AC

## 2011-08-19 MED ORDER — FERROUS SULFATE 325 (65 FE) MG PO TABS: 325.0000 mg | ORAL_TABLET | Freq: Three times a day (TID) | ORAL | Status: DC

## 2011-08-19 MED ORDER — HYDROCHLOROTHIAZIDE 12.5 MG PO CAPS
12.5000 mg | ORAL_CAPSULE | Freq: Every day | ORAL | Status: DC
  Administered 2011-08-19 – 2011-08-20 (×2): 12.5 mg via ORAL
  Filled 2011-08-19 (×2): qty 1

## 2011-08-19 MED ORDER — METHOCARBAMOL 500 MG PO TABS: 500.0000 mg | ORAL_TABLET | Freq: Four times a day (QID) | ORAL | Status: AC | PRN

## 2011-08-19 NOTE — Progress Notes (Signed)
Subjective: 1 Day Post-Op Procedure(s) (LRB): TOTAL HIP REVISION (Left)   Patient reports pain as mild. No events.  Objective:   VITALS:   Filed Vitals:   08/19/11 0600  BP: 132/74  Pulse: 62  Temp: 97.5 F (36.4 C)  Resp: 18    Neurovascular intact Dorsiflexion/Plantar flexion intact Incision: dressing C/D/I No cellulitis present Compartment soft  LABS  Basename 08/19/11 0355  HGB 11.3*  HCT 33.5*  WBC 15.0*  PLT 269     Basename 08/19/11 0355  NA 137  K 3.9  BUN 11  CREATININE 0.58  GLUCOSE 126*     Assessment/Plan: 1 Day Post-Op Procedure(s) (LRB): TOTAL HIP REVISION (Left)  Foley D/C'ed HV drain D/C'ed Advance diet Up with therapy D/C IV fluids Discharge home with home health tomorrow if she continues to do well.   Anastasio Auerbach Tunis Gentle   PAC  08/19/2011, 8:18 AM

## 2011-08-19 NOTE — Progress Notes (Signed)
08/19/2011 [name] BSN CCM [phone] Pt admitted with dx left total hip failure; left total hip revision on day of admission. CM spoke with patient regarding discharge plans. Pt plans to go back to home in [place], [place] where her friends will be caregivers. She has a schedule of caregivers that will stay with her at night also. Pt already has RW, commode seat and cane. She wants [place] for [facility] services. [facility] notified of pt's request. Start of services day after discharge.

## 2011-08-19 NOTE — Progress Notes (Signed)
Physical Therapy Treatment Patient Details Name: [name] MRN: [id number] DOB: Jul 12, 1951 Today's Date: 08/19/2011 1330 - 1413; 3GT PT Assessment/Plan  PT - Assessment/Plan PT Plan: Discharge plan remains appropriate PT Frequency: 7X/week Follow Up Recommendations: Home health PT Equipment Recommended: None recommended by PT PT Goals  Acute Rehab PT Goals Time For Goal Achievement: 7 days Pt will go Supine/Side to Sit: with supervision Pt will go Sit to Supine/Side: with supervision Pt will go Sit to Stand: with supervision PT Goal: Sit to Stand - Progress: Progressing toward goal Pt will go Stand to Sit: with supervision PT Goal: Stand to Sit - Progress: Progressing toward goal Pt will Ambulate: 51 - 150 feet;with supervision;with rolling walker PT Goal: Ambulate - Progress: Progressing toward goal Pt will Go Up / Down Stairs: 1-2 stairs;with min assist;with least restrictive assistive device  PT Treatment Precautions/Restrictions  Precautions Precautions: Posterior Hip Precaution Comments: sign hung in room Restrictions Weight Bearing Restrictions: No Other Position/Activity Restrictions: WBAT Mobility (including Balance) Transfers Sit to Stand: 4: Min assist;5: Supervision;With armrests;From chair/3-in-1;With upper extremity assist Sit to Stand Details (indicate cue type and reason): cues for LE position and use of UEs Stand to Sit: 4: Min assist;With armrests;To chair/3-in-1;With upper extremity assist Stand to Sit Details: cues for LE position and use of UEs Ambulation/Gait Ambulation/Gait Assistance: 4: Min assist Ambulation/Gait Assistance Details (indicate cue type and reason): cues for ER on L; position from RW and sequence Ambulation Distance (Feet): 140 Feet Assistive device: Rolling walker Gait Pattern: Step-to pattern    Exercise    End of Session PT - End of Session Activity Tolerance: Patient tolerated treatment well Patient left: in chair;with call  bell in reach Nurse Communication: Mobility status for transfers;Mobility status for ambulation General Behavior During Session: [facility] for tasks performed Cognition: [facility] for tasks performed  [name] 08/19/2011, 2:44 PM

## 2011-08-19 NOTE — Discharge Summary (Signed)
Physician Discharge Summary  Patient ID: TREVOR DUTY MRN: [id number] DOB/AGE: 63-Jul-1952 63 y.o.  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  Hypothyroidism.  Reflux.  Irritable bowel syndrome.  Hyperlipidemia.  Allergies.  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 Despite physical therapy and conservative measures, she continues to have pain in the hip and is now scheduled for revision of her acetabular component and ball and liner. The surgery, risks, benefits, and aftercare were discussed in detail with the patient.  Questions invited and answered. Surgery to go ahead as scheduled. Note that she does have a lot of problem with nausea and vomiting with codeine, oxycodone, hydrocodone. So we will try with Dilaudid p.o. postoperatively and then maybe home with tramadol as she did well with Darvocet postop last time, but that is no longer available.  PCP: No primary provider on file.   Discharged Condition: good  Hospital Course:  Patient underwent the above stated procedure on 08/18/2011. Patient tolerated the procedure well and brought to the recovery room in good condition and subsequently to the floor.  POD #1 BP: 132/74 ; Pulse: 62 ; Temp: 97.5 F (36.4 C) ; Resp: 18  Pt's foley was removed, as well as the hemovac drain removed. IV was changed to a saline lock. Patient reports pain as mild. No events. Neurovascular intact, dorsiflexion/plantar flexion intact, incision: dressing C/D/I, no cellulitis present and compartment soft.   LABS  Basename  08/19/11 0355   HGB  11.3*   HCT  33.5*    POD #2  BP: 154/73 ;  Pulse: 77 ; Temp: 98.1 F (36.7 C) ; Resp: 18  Patient reports pain as mild. No events. Feels ready to be discharged home with HHPT. Neurovascular intact, dorsiflexion/plantar flexion intact, incision: dressing C/D/I, no cellulitis present and compartment soft.  LABS  Basename  08/20/11 0359    HGB  11.3*    HCT  33.8*     Discharge Exam: Extremities: Homans sign is negative, no sign of DVT, no edema, redness or tenderness in the calves or thighs and no ulcers, gangrene or trophic changes  Disposition: Home with Home health PT, follow up in 2 weeks at [facility].  Discharge Orders    Future Orders Please Complete By Expires   Diet - low sodium heart healthy      Call MD / Call 911      Comments:   If you experience chest pain or shortness of breath, CALL 911 and be transported to the hospital emergency room.  If you develope a fever above 101 F, pus (white drainage) or increased drainage or redness at the wound, or calf pain, call your surgeon's office.   Discharge instructions      Comments:   Maintain surgical dressing for 8 days, then replace with gauze and tape. Keep the area dry and clean until follow up. Follow up in 2 weeks at [facility]. Call with any questions or concerns.     Constipation Prevention      Comments:   Drink plenty of fluids.  Prune juice may be helpful.  You may use a  stool softener, such as Colace (over the counter) 100 mg twice a day.  Use MiraLax (over the counter) for constipation as needed.   Increase activity slowly as tolerated      Weight Bearing as taught in Physical Therapy      Comments:   Use a walker or crutches as instructed.   Driving restrictions      Comments:   No driving for 4 weeks   Change dressing      Comments:   Maintain surgical dressing for 8 days, then replace with 4x4 guaze and tape. Keep the area dry and clean.   TED hose      Comments:   Use stockings (TED hose) for 2 weeks on both leg(s).  You  may remove them at night for sleeping.      Current Discharge Medication List    START taking these medications   Details  diphenhydrAMINE (BENADRYL) 25 mg capsule Take 1 capsule (25 mg total) by mouth every 6 (six) hours as needed for itching, allergies or sleep.    docusate sodium 100 MG CAPS Take 100 mg by mouth 2 (two) times daily.    ferrous sulfate 325 (65 FE) MG tablet Take 1 tablet (325 mg total) by mouth 3 (three) times daily after meals.    methocarbamol (ROBAXIN) 500 MG tablet Take 1 tablet (500 mg total) by mouth every 6 (six) hours as needed (muscle spasms). Qty: 50 tablet, Refills: 0    polyethylene glycol (MIRALAX / GLYCOLAX) packet Take 17 g by mouth 2 (two) times daily.    traMADol (ULTRAM) 50 MG tablet Take 1-2 tablets (50-100 mg total) by mouth every 6 (six) hours. Qty: 100 tablet, Refills: 0      CONTINUE these medications which have CHANGED   Details  aspirin EC 325 MG tablet Take 1 tablet (325 mg total) by mouth 2 (two) times daily. X 4 weeks Qty: 60 tablet, Refills: 0      CONTINUE these medications which have NOT CHANGED   Details  acetaminophen (TYLENOL) 500 MG tablet Take 500 mg by mouth every 6 (six) hours as needed. Pain     Calcium Carbonate-Vit D-Min 600-400 MG-UNIT TABS Take 1 tablet by mouth 2 (two) times daily.      cetirizine-pseudoephedrine (ZYRTEC-D) 5-120 MG per tablet Take 1 tablet by mouth 2 (two) times daily.     clotrimazole-betamethasone (LOTRISONE) cream Apply 1 application topically 2 (two) times daily as needed. Rash     Coenzyme Q10 (CO Q 10) 100 MG CAPS Take 1 capsule by mouth daily.      fluticasone (VERAMYST) 27.5 MCG/SPRAY nasal spray Place 1-2 sprays into the nose 2 (two) times daily.     hyoscyamine (HYOMAX-FT) 0.125 MG TBDP Place 0.125 mg under the tongue every 6 (six) hours as needed. For treat stomach and bladder problems.     levothyroxine (SYNTHROID, LEVOTHROID) 150 MCG tablet Take 150 mcg by mouth daily with  breakfast.     metoprolol (TOPROL-XL) 50 MG 24 hr tablet Take 25-50 mg by mouth 2 (two) times daily. 1 tablet in the morning and 0.5 tablet in the evening    Milk Thistle 175 MG CAPS Take 1 capsule by mouth daily.      omeprazole (PRILOSEC) 20 MG capsule Take 20 mg by mouth daily.     rosuvastatin (CRESTOR) 20 MG tablet Take 20 mg by mouth every evening.     Simethicone (PHAZYME) 180 MG CAPS Take 1 capsule by mouth  3 (three) times daily.     sodium chloride (OCEAN) 0.65 % nasal spray Place 4 sprays into the nose every 4 (four) hours as needed. Allergies      Ascorbic Acid (VITAMIN C) 1000 MG tablet Take 1,000 mg by mouth daily.      Cholecalciferol (VITAMIN D) 2000 UNITS tablet Take 2,000 Units by mouth daily.      fish oil-omega-3 fatty acids 1000 MG capsule Take 1 g by mouth daily.      Glucosamine-Chondroitin (GLUCOSAMINE CHONDR COMPLEX PO) Take 1 tablet by mouth 3 (three) times daily.      moexipril-hydrochlorothiazide (UNIRETIC) 15-25 MG per tablet Take 0.5 tablets by mouth daily with breakfast.     Wound Dressings (ATRAPRO HYDROGEL) GEL Apply 1 application topically 2 (two) times daily.       STOP taking these medications     ibuprofen (ADVIL,MOTRIN) 200 MG tablet Comments:  Reason for Stopping:          Signed:  Anastasio Auerbach. Anayely Constantine   PAC  08/20/2011, 8:46 AM

## 2011-08-19 NOTE — Progress Notes (Signed)
Physical Therapy Evaluation Patient Details Name: [name] MRN: [id number] DOB: Dec 16, 1950 Today's Date: 08/19/2011 0855 - 0835; EVAl, GT Problem List:  Patient Active Problem List  Diagnoses  . S/P revision of left total hip    Past Medical History:  Past Medical History  Diagnosis Date  . PONV (postoperative nausea and vomiting)   . Hypertension   . Environmental allergies   . Arthritis   . Sensitive skin   . IBS (irritable bowel syndrome)   . GERD (gastroesophageal reflux disease)   . Hypothyroidism   . Sleeping difficulties   . Depression    Past Surgical History:  Past Surgical History  Procedure Date  . Joint replacement     L TOTAL HIP 2008  . Nasal septum surgery     PT Assessment/Plan/Recommendation PT Assessment Clinical Impression Statement: Pt wtih L THR revision presents with limited L LE strength and limited functional mobility.  Pt will benefit from skilled PT intervention to maximize IND for d/c home PT Recommendation/Assessment: Patient will need skilled PT in the acute care venue PT Problem List: Decreased strength;Decreased activity tolerance;Decreased mobility;Decreased knowledge of use of DME;Pain PT Therapy Diagnosis : Difficulty walking PT Plan PT Frequency: 7X/week PT Treatment/Interventions: DME instruction;Gait training;Stair training;Functional mobility training;Therapeutic activities;Therapeutic exercise;Patient/family education PT Recommendation Recommendations for Other Services: OT consult Follow Up Recommendations: Home health PT Equipment Recommended: None recommended by PT PT Goals  Acute Rehab PT Goals PT Goal Formulation: With patient Time For Goal Achievement: 7 days Pt will go Supine/Side to Sit: with supervision PT Goal: Supine/Side to Sit - Progress: Progressing toward goal Pt will go Sit to Supine/Side: with supervision PT Goal: Sit to Supine/Side - Progress: Not met Pt will go Sit to Stand: with supervision PT Goal:  Sit to Stand - Progress: Progressing toward goal Pt will go Stand to Sit: with supervision PT Goal: Stand to Sit - Progress: Progressing toward goal Pt will Ambulate: 51 - 150 feet;with supervision;with rolling walker PT Goal: Ambulate - Progress: Progressing toward goal Pt will Go Up / Down Stairs: 1-2 stairs;with min assist;with least restrictive assistive device PT Goal: Up/Down Stairs - Progress: Not met  PT Evaluation Precautions/Restrictions  Precautions Precautions: Posterior Hip Precaution Comments: sign hung in room Restrictions Weight Bearing Restrictions: No Other Position/Activity Restrictions: WBAT Prior Functioning  Home Living Lives With: Alone Receives Help From: Neighbor;Friend(s) Type of Home: House Home Layout: One level Home Access: Stairs to enter Entrance Stairs-Rails: None Entrance Stairs-Number of Steps: 1 Prior Function Level of Independence: Independent with basic ADLs;Independent with gait;Independent with transfers Able to Take Stairs?: Yes Cognition Cognition Arousal/Alertness: Awake/alert Overall Cognitive Status: Appears within functional limits for tasks assessed Orientation Level: Oriented X4 Sensation/Coordination Coordination Gross Motor Movements are Fluid and Coordinated: Yes Extremity Assessment RUE Assessment RUE Assessment: Within Functional Limits LUE Assessment LUE Assessment: Within Functional Limits RLE Assessment RLE Assessment: Within Functional Limits LLE Assessment LLE Assessment: Exceptions to [facility] Mobility (including Balance) Bed Mobility Bed Mobility: Yes Supine to Sit: 3: Mod assist Supine to Sit Details (indicate cue type and reason): cues for use of UEs and for THP Transfers Transfers: Yes Sit to Stand: 4: Min assist;3: Mod assist;With upper extremity assist;From bed Sit to Stand Details (indicate cue type and reason): cues for use of UEs and for LE position Stand to Sit: 4: Min assist;3: Mod assist Stand to  Sit Details: cues for use of UEs and for LE position Ambulation/Gait Ambulation/Gait: Yes Ambulation/Gait Assistance: 4: Min assist Ambulation/Gait Assistance  Details (indicate cue type and reason): cues for sequence, posture, ER on L, and position from RW Ambulation Distance (Feet): 58 Feet Assistive device: Rolling walker Gait Pattern: Step-to pattern    Exercise  Total Joint Exercises Ankle Circles/Pumps: AROM;10 reps;Supine Heel Slides: AAROM;10 reps;Supine;Left Hip ABduction/ADduction: AAROM;10 reps;Supine;Left End of Session PT - End of Session Activity Tolerance: Patient tolerated treatment well Patient left: in chair;with call bell in reach Nurse Communication: Mobility status for transfers;Mobility status for ambulation General Behavior During Session: [facility] for tasks performed Cognition: [facility] for tasks performed  Jese Comella 08/19/2011, 12:28 PM

## 2011-08-20 LAB — BASIC METABOLIC PANEL
BUN: 13 mg/dL (ref 6–23)
Chloride: 101 mEq/L (ref 96–112)
Glucose, Bld: 124 mg/dL — ABNORMAL HIGH (ref 70–99)
Potassium: 3.8 mEq/L (ref 3.5–5.1)

## 2011-08-20 LAB — CBC
HCT: 33.8 % — ABNORMAL LOW (ref 36.0–46.0)
Hemoglobin: 11.3 g/dL — ABNORMAL LOW (ref 12.0–15.0)
MCHC: 33.4 g/dL (ref 30.0–36.0)
WBC: 15.2 10*3/uL — ABNORMAL HIGH (ref 4.0–10.5)

## 2011-08-20 MED ORDER — TRAMADOL HCL 50 MG PO TABS: 50.0000 mg | ORAL_TABLET | Freq: Four times a day (QID) | ORAL | Status: AC

## 2011-08-20 MED ORDER — TRAMADOL HCL 50 MG PO TABS: 50.0000 mg | ORAL_TABLET | Freq: Four times a day (QID) | ORAL | Status: DC

## 2011-08-20 NOTE — Progress Notes (Signed)
08/20/2011 Raynelle Bring BSN CCM [phone] Pt for discharge today. [facility] services in place. Services for HHPT will start tomorrow 08/21/2011. Case closed.

## 2011-08-20 NOTE — Progress Notes (Signed)
Subjective: 2 Days Post-Op Procedure(s) (LRB): TOTAL HIP REVISION (Left)   Patient reports pain as mild. No events. Feels ready to be discharged home with HHPT.  Objective:   VITALS:   Filed Vitals:   08/20/11 0705  BP: 154/73  Pulse: 77  Temp: 98.1 F (36.7 C)  Resp: 18    Neurovascular intact Dorsiflexion/Plantar flexion intact Incision: dressing C/D/I No cellulitis present Compartment soft  LABS  Basename 08/20/11 0359 08/19/11 0355  HGB 11.3* 11.3*  HCT 33.8* 33.5*  WBC 15.2* 15.0*  PLT 261 269     Basename 08/20/11 0359 08/19/11 0355  NA 137 137  K 3.8 3.9  BUN 13 11  CREATININE 0.53 0.58  GLUCOSE 124* 126*     Assessment/Plan: 2 Days Post-Op Procedure(s) (LRB): TOTAL HIP REVISION (Left)   Up with therapy Discharge home with home health today   Anastasio Auerbach. Sariyah Corcino   PAC  08/20/2011, 8:42 AM

## 2011-08-20 NOTE — Progress Notes (Signed)
Physical Therapy Treatment Patient Details Name: YOVANA SCOGIN MRN: [id number] DOB: 04/16/1951 Today's Date: 08/20/2011 4696 - 0947; GT< TA< TE PT Assessment/Plan  PT - Assessment/Plan Comments on Treatment Session: Reviewed car transfers with pt PT Plan: Discharge plan remains appropriate PT Frequency: 7X/week Follow Up Recommendations: Home health PT Equipment Recommended: None recommended by PT PT Goals  Acute Rehab PT Goals Time For Goal Achievement: 7 days Pt will go Supine/Side to Sit: with supervision PT Goal: Supine/Side to Sit - Progress: Other (comment) (met based on pt report - not observed) Pt will go Sit to Supine/Side: with supervision PT Goal: Sit to Supine/Side - Progress: Other (comment) (not observed but met based on pt report) Pt will go Sit to Stand: with supervision PT Goal: Sit to Stand - Progress: Met Pt will go Stand to Sit: with supervision PT Goal: Stand to Sit - Progress: Met Pt will Ambulate: 51 - 150 feet;with supervision;with rolling walker PT Goal: Ambulate - Progress: Met Pt will Go Up / Down Stairs: 1-2 stairs;with min assist;with least restrictive assistive device PT Goal: Up/Down Stairs - Progress: Met  PT Treatment Precautions/Restrictions  Precautions Precautions: Posterior Hip Precaution Comments: sign hung in room Restrictions Weight Bearing Restrictions: No Other Position/Activity Restrictions: WBAT Mobility (including Balance) Bed Mobility Bed Mobility:  (Pt states has been getting in/out bed unassisted overnight) Transfers Sit to Stand: 5: Supervision;From chair/3-in-1;With armrests;With upper extremity assist Sit to Stand Details (indicate cue type and reason): cues for use of UEs Stand to Sit: 5: Supervision;With upper extremity assist;To chair/3-in-1;With armrests Stand to Sit Details: cues for LE  position Ambulation/Gait Ambulation/Gait Assistance: 5: Supervision Ambulation/Gait Assistance Details (indicate cue type and  reason): cues for ER on L and position from RW Ambulation Distance (Feet): 140 Feet Assistive device: Rolling walker Gait Pattern: Step-to pattern Stairs: Yes Stairs Assistance: 4: Min assist Stairs Assistance Details (indicate cue type and reason): cues for sequence and foot/RW placement Stair Management Technique: Backwards;Forwards;With walker Number of Stairs: 1  (x2)    Exercise  Total Joint Exercises Ankle Circles/Pumps: AROM;10 reps;Supine;20 reps Gluteal Sets: AROM;10 reps;Supine;Both Heel Slides: AAROM;20 reps;Supine;Left Hip ABduction/ADduction: AAROM;20 reps;Supine;Left Long Arc Quad: Both;20 reps;Seated;AROM End of Session PT - End of Session Activity Tolerance: Patient tolerated treatment well Patient left: in chair;with call bell in reach Nurse Communication: Mobility status for transfers;Mobility status for ambulation General Behavior During Session: [facility] for tasks performed Cognition: [facility] for tasks performed  Lynnie Koehler 08/20/2011, 12:50 PM

## 2011-11-17 ENCOUNTER — Telehealth: Payer: Self-pay | Admitting: Internal Medicine

## 2011-11-17 NOTE — Telephone Encounter (Signed)
Received copies from [facility] ,on 11/14/11 . Forwarded 21  pages to Dr. Juanda Chance ,for review.

## 2011-12-01 ENCOUNTER — Encounter: Payer: Self-pay | Admitting: Internal Medicine

## 2012-01-08 ENCOUNTER — Encounter: Payer: Self-pay | Admitting: *Deleted

## 2012-01-28 ENCOUNTER — Encounter: Payer: Self-pay | Admitting: Internal Medicine

## 2012-01-28 ENCOUNTER — Ambulatory Visit (INDEPENDENT_AMBULATORY_CARE_PROVIDER_SITE_OTHER): Payer: BC Managed Care – PPO | Admitting: Internal Medicine

## 2012-01-28 VITALS — BP 108/58 | HR 72 | Ht 60.0 in | Wt 187.4 lb

## 2012-01-28 DIAGNOSIS — K624 Stenosis of anus and rectum: Secondary | ICD-10-CM

## 2012-01-28 DIAGNOSIS — K648 Other hemorrhoids: Secondary | ICD-10-CM

## 2012-01-28 MED ORDER — DICYCLOMINE HCL 20 MG PO TABS: 20.0000 mg | ORAL_TABLET | Freq: Every day | ORAL | Status: DC

## 2012-01-28 NOTE — Progress Notes (Signed)
[name] 01-21-1951 MRN [id number]   History of Present Illness:  This is a 61 year old white female with chronic perianal irritation of at least 20 years duration. She was followed by Dr.Mann and had 2 colonoscopies. The first one was in 2001 and the second one was in 2006 and showed internal hemorrhoids. The cause of the rectal pain, bleeding and irritation has never been fully diagnosed. However, when she discontinued using wet cotton wipe the irritation went away.. She was treated in the past for irritable bowel syndrome with antispasmodics, Imodium and dietary modifications. 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 Since she stopped using her wet wipes, the skin maceration around the rectum has markedly improved to the point where today she has no complaints. She has not had any pain or irritation in the past several weeks. She even stopped using her topical gel, Atrapro hydrogel. She is very happy about this and feels that her moisture wipes were causing the skin irritation.  Past Medical History  Diagnosis Date  . PONV (postoperative nausea and vomiting)   . Hypertension   . Environmental allergies   . Arthritis   . Sensitive skin   . IBS (irritable bowel syndrome)   . GERD (gastroesophageal reflux disease)   . Hypothyroidism   . Sleeping difficulties   . Depression   . Hyperlipidemia   . Internal hemorrhoids   . Diverticulosis   . Pruritus ani    Past Surgical History  Procedure Date  . Total hip arthroplasty 2008    left  . Nasal septum surgery   . Total hip revision 08/18/2011    Procedure: TOTAL HIP REVISION;  Surgeon: Shelda Pal;  Location: [facility] ORS;  Service: Orthopedics;  Laterality: Left;  acetabular cup and liner exchange    reports that she quit smoking about 20 years ago. She does not have any smokeless tobacco history on file. She reports that she drinks alcohol. She reports that she does not use illicit drugs. family history  includes Alzheimer's disease in her mother; Breast cancer in her sister; and Parkinsonism in her father.  There is no history of Colon cancer. Allergies  Allergen Reactions  . Compazine Other (See Comments)    Had when was a child-staring at the ceiling.         Review of Systems: Denies abdominal pain. Positive for occasional bloating and frequent stools  The remainder of the 10 point ROS is negative except as outlined in H&P   Physical Exam: General appearance  Well developed, in no distress., Overweight Eyes- non icteric. HEENT nontraumatic, normocephalic. Mouth no lesions, tongue papillated, no cheilosis. Neck supple without adenopathy, thyroid not enlarged, no carotid bruits, no JVD. Lungs Clear to auscultation bilaterally. Cor normal S1, normal S2, regular rhythm, no murmur,  quiet precordium. Abdomen: Obese soft with normoactive bowel sounds. No tenderness. No mass. Liver edge at costal margin. Rectal: And anoscopic exam reveals intense erythema in the perirectal area 3-4 cm in diameter. Skin is completely dry and there is no active maceration, bleeding or weeping. There are small skin tags at the rectal orifice. 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 Anoscope is placed into the rectum with some resistance and there is some bleeding from the anal canal but I don't see any fissure and patient is not having any significant discomfort. The rectal ampulla appears normal with small first grade hemorrhoids. Extremities no pedal edema. Skin no lesions. Neurological alert and oriented x 3. Psychological  normal mood and affect.  Assessment and Plan:  Problem #1 Chronic perianal dermatitis is now completely resolved after discontinuation of the topical moisture wipes made by [employer]. There is no active dermatitis present at this time. She has had mild anal stenosis but she remains asymptomatic from it. She is up-to-date on her colonoscopy. A recall exam should be in September 2016. We have  discussed irritable bowel syndrome and the use of antispasmodics to decrease the frequency of her stools. We will send Bentyl 20 mg for her to take when necessary. She has in the past tried Metamucil unsuccessfully. We talked about dietary modifications to avoid diarrhea. She may use Imodium when necessary. If the irritation recurs, I would like to see her first in the office. She will avoid using the [employer] wipes because of contact dermatitis. She may restart hydrogel as needed. I am not adding any new topical medication since she is doing well but I would consider in the future using Balneol (she has some) and Calmoseptine, which I have samples of. I would like to see her again in 6 months.   01/28/2012 [name]

## 2012-01-28 NOTE — Patient Instructions (Addendum)
We have sent the following medications to your pharmacy: Bentyl  You will be due for a recall colonoscopy in 04/2015. We will send you a reminder in the mail when it gets closer to that time. Follow up with Dr Juanda Chance in 6 months. CC: Dr Andi Devon, Dr Marvel Plan

## 2013-03-18 ENCOUNTER — Other Ambulatory Visit: Payer: Self-pay | Admitting: Obstetrics and Gynecology

## 2013-04-02 ENCOUNTER — Other Ambulatory Visit: Payer: Self-pay | Admitting: Internal Medicine

## 2013-04-18 ENCOUNTER — Other Ambulatory Visit: Payer: Self-pay | Admitting: *Deleted

## 2013-04-18 DIAGNOSIS — I10 Essential (primary) hypertension: Secondary | ICD-10-CM

## 2013-04-28 ENCOUNTER — Ambulatory Visit (HOSPITAL_COMMUNITY)
Admission: RE | Admit: 2013-04-28 | Discharge: 2013-04-28 | Disposition: A | Discharge status: Home / Self Care | Payer: BC Managed Care – PPO | Source: Ambulatory Visit | Attending: Internal Medicine | Admitting: Internal Medicine

## 2013-04-28 DIAGNOSIS — I10 Essential (primary) hypertension: Secondary | ICD-10-CM | POA: Insufficient documentation

## 2013-04-28 NOTE — Progress Notes (Signed)
2D Echo Performed 04/28/2013    [name], RCS  

## 2013-05-06 ENCOUNTER — Encounter: Payer: Self-pay | Admitting: Cardiovascular Disease

## 2013-08-24 ENCOUNTER — Encounter: Payer: Self-pay | Admitting: Internal Medicine

## 2013-08-24 ENCOUNTER — Ambulatory Visit (INDEPENDENT_AMBULATORY_CARE_PROVIDER_SITE_OTHER): Payer: BC Managed Care – PPO | Admitting: Internal Medicine

## 2013-08-24 VITALS — BP 120/52 | HR 64 | Ht 59.0 in | Wt 182.2 lb

## 2013-08-24 DIAGNOSIS — K589 Irritable bowel syndrome without diarrhea: Secondary | ICD-10-CM

## 2013-08-24 MED ORDER — HYOSCYAMINE SULFATE 0.125 MG SL SUBL: 0.1250 mg | SUBLINGUAL_TABLET | SUBLINGUAL | Status: DC | PRN

## 2013-08-24 NOTE — Patient Instructions (Addendum)
We have sent the following prescriptions to your mail in pharmacy: Levsin   If you have not heard from your mail in pharmacy within 1 week or if you have not received your medication in the mail, please contact us at [phone] so we may find out why.  You will be due for a recall colonoscopy in 04/2015. We will send you a reminder in the mail when it gets closer to that time. Cc Dr Willey Blade, Dr Philis Pique

## 2013-08-24 NOTE — Progress Notes (Signed)
[name] May 08, 1951 [id number]  Note: This dictation was prepared with Dragon digital system. Any transcriptional errors that result from this procedure are unintentional.   History of Present Illness: This is a 63 year old white female with diarrhea predominant irritable bowel syndrome, anal irritation and perirectal dermatitis for which she was seen in June 2013. She found out that she has an allergy to cotton wipes and stopped using them which resulted in complete healing of her perirectal irritation. She still has urgent and sometimes incontinent bowel movements. She needs a refills on Levsin 0.125 mg. She had colonoscopies  in 2001 and 2006. A recall colonoscopy will be due in 2016. Her last hemoglobin in our records from January 2013 was 11.3.    Past Medical History  Diagnosis Date  . PONV (postoperative nausea and vomiting)   . Hypertension   . Environmental allergies   . Osteoarthritis   . Sensitive skin   . IBS (irritable bowel syndrome)   . GERD (gastroesophageal reflux disease)   . Hypothyroidism   . Sleeping difficulties   . Depression   . Hyperlipidemia   . Internal hemorrhoids   . Diverticulosis   . Pruritus ani     Past Surgical History  Procedure Laterality Date  . Total hip arthroplasty  2008    left  . Nasal septum surgery  11/2010  . Total hip revision  08/18/2011    Procedure: TOTAL HIP REVISION;  Surgeon: Mauri Pole;  Location: [facility];  Service: Orthopedics;  Laterality: Left;  acetabular cup and liner exchange    Allergies  Allergen Reactions  . Compazine Other (See Comments)    Had when was a child-staring at the ceiling.     Family history and social history have been reviewed.  Review of Systems:   The remainder of the 10 point ROS is negative except as outlined in the H&P  Physical Exam: General Appearance Well developed, in no distress Eyes  Non icteric  HEENT  Non traumatic, normocephalic  Mouth No lesion, tongue papillated, no  cheilosis Neck Supple without adenopathy, thyroid not enlarged, no carotid bruits, no JVD Lungs Clear to auscultation bilaterally COR Normal S1, normal S2, regular rhythm, no murmur, quiet precordium Abdomen obese soft with normoactive bowel sounds. No tenderness. No distention. No tympany Rectal hyperpigmentation of peri-rectal area, no active dermatitis. Small skin tags. Normal rectal sphincter tone. Small amount of soft Hemoccult negative stool Extremities  No pedal edema Skin No lesions Neurological Alert and oriented x 3 Psychological Normal mood and affect  Assessment and Plan:   Problem #27 63 year old white female with diarrhea predominant irritable bowel syndrome. Her symptoms are managed with antispasmodics. She tried Bentyl without good response so she wants a refill on Levsin sublingual. She will follow a high fiber diet. Patient will be due for a recall colonoscopy in September 2016.    [name] 08/24/2013

## 2013-08-25 ENCOUNTER — Encounter: Payer: Self-pay | Admitting: Internal Medicine

## 2014-02-16 ENCOUNTER — Telehealth: Payer: Self-pay | Admitting: Internal Medicine

## 2014-02-16 NOTE — Telephone Encounter (Signed)
Spoke with patient and she states she has a hx of IBS primarily diarrhea.  She reports she had an "accident" on the way home last night.  Reports another episode like this after a pot luck at work. She had a salad for lunch that she had made. She is asking if there is anything she can take besides Levsin for this. Discussed common factors prior to her episode. (foods she does not usually eat). Not easy to give a medication when episodes are not frequent or regular. She will continue with Levsin prn.

## 2014-10-12 ENCOUNTER — Encounter: Payer: Self-pay | Admitting: Internal Medicine

## 2014-12-13 ENCOUNTER — Ambulatory Visit (AMBULATORY_SURGERY_CENTER): Payer: Self-pay

## 2014-12-13 VITALS — Ht 59.0 in | Wt 190.0 lb

## 2014-12-13 DIAGNOSIS — Z1211 Encounter for screening for malignant neoplasm of colon: Secondary | ICD-10-CM

## 2014-12-13 MED ORDER — MOVIPREP 100 G PO SOLR: 1.0000 | Freq: Once | ORAL | Status: DC

## 2014-12-13 NOTE — Progress Notes (Signed)
No allergies to eggs or soy No diet/weight loss meds No home oxygen No past problems with anesthesia  Has email  Emmi instructions given for colonoscopy 

## 2014-12-27 ENCOUNTER — Encounter: Payer: Self-pay | Admitting: Internal Medicine

## 2014-12-27 ENCOUNTER — Other Ambulatory Visit: Payer: Self-pay | Admitting: Internal Medicine

## 2014-12-27 ENCOUNTER — Ambulatory Visit (AMBULATORY_SURGERY_CENTER): Payer: BLUE CROSS/BLUE SHIELD | Admitting: Internal Medicine

## 2014-12-27 VITALS — BP 133/71 | HR 60 | Temp 96.8°F | Resp 15 | Ht 59.0 in | Wt 190.0 lb

## 2014-12-27 DIAGNOSIS — D125 Benign neoplasm of sigmoid colon: Secondary | ICD-10-CM | POA: Diagnosis not present

## 2014-12-27 DIAGNOSIS — Z1211 Encounter for screening for malignant neoplasm of colon: Secondary | ICD-10-CM

## 2014-12-27 MED ORDER — SODIUM CHLORIDE 0.9 % IV SOLN: 500.0000 mL | INTRAVENOUS | Status: DC

## 2014-12-27 NOTE — Progress Notes (Signed)
Called to room to assist during endoscopic procedure.  Patient ID and intended procedure confirmed with present staff. Received instructions for my participation in the procedure from the performing physician.  

## 2014-12-27 NOTE — Patient Instructions (Signed)
See Colonoscopy report for findings abd recommendations  YOU HAD AN ENDOSCOPIC PROCEDURE TODAY AT [facility]:   Refer to the procedure report that was given to you for any specific questions about what was found during the examination.  If the procedure report does not answer your questions, please call your gastroenterologist to clarify.  If you requested that your care partner not be given the details of your procedure findings, then the procedure report has been included in a sealed envelope for you to review at your convenience later.  YOU SHOULD EXPECT: Some feelings of bloating in the abdomen. Passage of more gas than usual.  Walking can help get rid of the air that was put into your GI tract during the procedure and reduce the bloating. If you had a lower endoscopy (such as a colonoscopy or flexible sigmoidoscopy) you may notice spotting of blood in your stool or on the toilet paper. If you underwent a bowel prep for your procedure, you may not have a normal bowel movement for a few days.  Please Note:  You might notice some irritation and congestion in your nose or some drainage.  This is from the oxygen used during your procedure.  There is no need for concern and it should clear up in a day or so.  SYMPTOMS TO REPORT IMMEDIATELY:   Following lower endoscopy (colonoscopy or flexible sigmoidoscopy):  Excessive amounts of blood in the stool  Significant tenderness or worsening of abdominal pains  Swelling of the abdomen that is new, acute  Fever of 100F or higher   Following upper endoscopy (EGD)  Vomiting of blood or coffee ground material  New chest pain or pain under the shoulder blades  Painful or persistently difficult swallowing  New shortness of breath  Fever of 100F or higher  Black, tarry-looking stools  For urgent or emergent issues, a gastroenterologist can be reached at any hour by calling [phone].   DIET: Your first meal following the  procedure should be a small meal and then it is ok to progress to your normal diet. Heavy or fried foods are harder to digest and may make you feel nauseous or bloated.  Likewise, meals heavy in dairy and vegetables can increase bloating.  Drink plenty of fluids but you should avoid alcoholic beverages for 24 hours.  ACTIVITY:  You should plan to take it easy for the rest of today and you should NOT DRIVE or use heavy machinery until tomorrow (because of the sedation medicines used during the test).    FOLLOW UP: Our staff will call the number listed on your records the next business day following your procedure to check on you and address any questions or concerns that you may have regarding the information given to you following your procedure. If we do not reach you, we will leave a message.  However, if you are feeling well and you are not experiencing any problems, there is no need to return our call.  We will assume that you have returned to your regular daily activities without incident.  If any biopsies were taken you will be contacted by phone or by letter within the next 1-3 weeks.  Please call us at [phone] if you have not heard about the biopsies in 3 weeks.    SIGNATURES/CONFIDENTIALITY: You and/or your care partner have signed paperwork which will be entered into your electronic medical record.  These signatures attest to the fact that that the information above  on your After Visit Summary has been reviewed and is understood.  Full responsibility of the confidentiality of this discharge information lies with you and/or your care-partner.  Please follow all discharge instructions given to you by the recovery room nurse. If you have any questions or problems after discharge please call one of the numbers listed above. You will receive a phone call in the am to see how you are doing and answer any questions you may have. Thank you for choosing [facility] for your health  care needs. 

## 2014-12-27 NOTE — Op Note (Signed)
Tracey  [address] [place], [address]   COLONOSCOPY PROCEDURE REPORT  PATIENT: [name], [name]  MR#: [id number] BIRTHDATE: 1951-03-07 , 27  yrs. old GENDER: female ENDOSCOPIST: Lafayette Dragon, MD REFERRED Edwinna Areola, M.D. , Dr Philis Pique PROCEDURE DATE:  12/27/2014 PROCEDURE:   Colonoscopy, screening and Colonoscopy with snare polypectomy First Screening Colonoscopy - Avg.  risk and is 50 yrs.  old or older - No.  Prior Negative Screening - Now for repeat screening. 10 or more years since last screening  History of Adenoma - Now for follow-up colonoscopy & has been > or = to 3 yrs.  N/A  Polyps removed today? Yes ASA CLASS:   Class II INDICATIONS:Screening for colonic neoplasia, Colorectal Neoplasm Risk Assessment for this procedure is average risk, and prior colonoscopy in 2001 and 2006.  No family history of colon cancer. Patient has irritable bowel syndrome. MEDICATIONS: Monitored anesthesia care and Propofol 350 mg IV  DESCRIPTION OF PROCEDURE:   After the risks benefits and alternatives of the procedure were thoroughly explained, informed consent was obtained.  The digital rectal exam revealed no abnormalities of the rectum.   The LB [id number]-H190 [id number]  endoscope was introduced through the anus and advanced to the cecum, which was identified by both the appendix and ileocecal valve. No adverse events experienced.   The quality of the prep was good.  (MoviPrep was used)  The instrument was then slowly withdrawn as the colon was fully examined. Estimated blood loss is zero unless otherwise noted in this procedure report.      COLON FINDINGS: A firm sessile polyp measuring 10 mm in size was found in the sigmoid colon.  A polypectomy was performed with a cold snare.  The resection was complete, the polyp tissue was completely retrieved and sent to histology.   There was mild diverticulosis noted in the sigmoid colon.  Retroflexed views revealed no  abnormalities. The time to cecum = 5.54 Withdrawal time = 7.21   The scope was withdrawn and the procedure completed. COMPLICATIONS: There were no immediate complications.  ENDOSCOPIC IMPRESSION: 1.   Sessile polyp was found in the sigmoid colon; polypectomy was performed with a cold snare 2.   There was mild diverticulosis noted in the sigmoid colon  RECOMMENDATIONS: 1.  Await pathology results 2.  High fiber diet Recall colonoscopy pending path report  eSigned:  Lafayette Dragon, MD 12/27/2014 12:09 PM   cc:   PATIENT NAME:  Tracey, Mcknight MR#: [id number]

## 2014-12-27 NOTE — Progress Notes (Signed)
Stable to RR 

## 2014-12-28 ENCOUNTER — Telehealth: Payer: Self-pay | Admitting: *Deleted

## 2014-12-28 NOTE — Telephone Encounter (Signed)
Name identifier, left message, follow-up 

## 2015-01-01 ENCOUNTER — Encounter: Payer: Self-pay | Admitting: Internal Medicine

## 2015-02-06 ENCOUNTER — Encounter: Payer: Self-pay | Admitting: Podiatry

## 2015-02-06 ENCOUNTER — Ambulatory Visit: Payer: BLUE CROSS/BLUE SHIELD

## 2015-02-06 ENCOUNTER — Ambulatory Visit (INDEPENDENT_AMBULATORY_CARE_PROVIDER_SITE_OTHER): Payer: BLUE CROSS/BLUE SHIELD

## 2015-02-06 ENCOUNTER — Ambulatory Visit (INDEPENDENT_AMBULATORY_CARE_PROVIDER_SITE_OTHER): Payer: BLUE CROSS/BLUE SHIELD | Admitting: Podiatry

## 2015-02-06 VITALS — BP 141/72 | HR 70 | Resp 12

## 2015-02-06 DIAGNOSIS — M2032 Hallux varus (acquired), left foot: Secondary | ICD-10-CM | POA: Diagnosis not present

## 2015-02-06 DIAGNOSIS — M205X9 Other deformities of toe(s) (acquired), unspecified foot: Secondary | ICD-10-CM

## 2015-02-06 DIAGNOSIS — M2011 Hallux valgus (acquired), right foot: Secondary | ICD-10-CM

## 2015-02-06 DIAGNOSIS — M79673 Pain in unspecified foot: Secondary | ICD-10-CM

## 2015-02-06 DIAGNOSIS — M21629 Bunionette of unspecified foot: Secondary | ICD-10-CM

## 2015-02-06 NOTE — Progress Notes (Signed)
   Subjective:    Patient ID: [name], female    DOB: 09-Jan-1951, 64 y.o.   MRN: [id number]  HPI  B/L TAILORS BUNION ON B/L FEET AND BEEN PAINFUL FOR 3 MONTHS. B/L FEET ARE LITTLE BETTER BUT WHEN WEARING CLOSED SHOES THEY GET WORSE. [facility] PRESCRIBE CORTISONE SHOT AND IT HELP.  Review of Systems  HENT: Positive for sinus pressure.   Musculoskeletal: Positive for back pain and gait problem.  Hematological: Bruises/bleeds easily.  All other systems reviewed and are negative.      Objective:   Physical Exam: I have reviewed her past medical history medications allergy surgery social history and review of systems. Pulses are strongly palpable. Neurologic sensorium is intact bilateral. Deep tendon reflexes are brisk and equal bilateral. Orthopedic evaluation demonstrates all joints distal to the ankle range of motion without crepitation. Mild metatarsus adductus with the exception of lateral deviating fifth metatarsal resulting in a tailors bunion deformity. Radiographs confirm the metatarsus adductus in the lateral deviation of the fifth met and the tear is bunion deformity with the adductor varus rotated hammertoe deformity fifth bilateral. Right appears to be worse than the left.        Assessment & Plan:  Assessment: Capsulitis tailors bunion deformity right.  Plan: Discussed etiology pathology conservative versus surgical therapies. Discussed appropriate shoe gear stretching exercises ice therapy shear modifications. Discussed the need for surgical intervention at some point. We'll follow up with her as needed.

## 2015-02-23 ENCOUNTER — Encounter: Payer: Self-pay | Admitting: *Deleted

## 2015-03-02 ENCOUNTER — Encounter: Payer: Self-pay | Admitting: Cardiology

## 2015-07-25 DIAGNOSIS — L309 Dermatitis, unspecified: Secondary | ICD-10-CM | POA: Diagnosis not present

## 2015-07-25 DIAGNOSIS — D239 Other benign neoplasm of skin, unspecified: Secondary | ICD-10-CM | POA: Diagnosis not present

## 2015-08-14 DIAGNOSIS — E785 Hyperlipidemia, unspecified: Secondary | ICD-10-CM | POA: Diagnosis not present

## 2015-08-14 DIAGNOSIS — N182 Chronic kidney disease, stage 2 (mild): Secondary | ICD-10-CM | POA: Diagnosis not present

## 2015-08-14 DIAGNOSIS — I129 Hypertensive chronic kidney disease with stage 1 through stage 4 chronic kidney disease, or unspecified chronic kidney disease: Secondary | ICD-10-CM | POA: Diagnosis not present

## 2015-08-14 DIAGNOSIS — R05 Cough: Secondary | ICD-10-CM | POA: Diagnosis not present

## 2015-08-16 ENCOUNTER — Other Ambulatory Visit: Payer: Self-pay

## 2015-08-16 ENCOUNTER — Other Ambulatory Visit (HOSPITAL_COMMUNITY): Payer: Self-pay | Admitting: Internal Medicine

## 2015-08-16 ENCOUNTER — Ambulatory Visit (HOSPITAL_COMMUNITY): Payer: Medicare Other | Attending: Cardiovascular Disease

## 2015-08-16 DIAGNOSIS — I517 Cardiomegaly: Secondary | ICD-10-CM | POA: Diagnosis not present

## 2015-08-16 DIAGNOSIS — R011 Cardiac murmur, unspecified: Secondary | ICD-10-CM | POA: Insufficient documentation

## 2015-10-08 DIAGNOSIS — R7309 Other abnormal glucose: Secondary | ICD-10-CM | POA: Diagnosis not present

## 2015-10-08 DIAGNOSIS — I129 Hypertensive chronic kidney disease with stage 1 through stage 4 chronic kidney disease, or unspecified chronic kidney disease: Secondary | ICD-10-CM | POA: Diagnosis not present

## 2015-10-08 DIAGNOSIS — E559 Vitamin D deficiency, unspecified: Secondary | ICD-10-CM | POA: Diagnosis not present

## 2015-10-08 DIAGNOSIS — N390 Urinary tract infection, site not specified: Secondary | ICD-10-CM | POA: Diagnosis not present

## 2015-10-15 DIAGNOSIS — I129 Hypertensive chronic kidney disease with stage 1 through stage 4 chronic kidney disease, or unspecified chronic kidney disease: Secondary | ICD-10-CM | POA: Diagnosis not present

## 2015-10-15 DIAGNOSIS — I5032 Chronic diastolic (congestive) heart failure: Secondary | ICD-10-CM | POA: Diagnosis not present

## 2015-10-15 DIAGNOSIS — R0602 Shortness of breath: Secondary | ICD-10-CM | POA: Diagnosis not present

## 2015-10-15 DIAGNOSIS — R7309 Other abnormal glucose: Secondary | ICD-10-CM | POA: Diagnosis not present

## 2015-10-15 DIAGNOSIS — N182 Chronic kidney disease, stage 2 (mild): Secondary | ICD-10-CM | POA: Diagnosis not present

## 2015-10-25 ENCOUNTER — Inpatient Hospital Stay (HOSPITAL_COMMUNITY): Admission: RE | Admit: 2015-10-25 | Payer: Medicare Other | Source: Ambulatory Visit

## 2015-10-26 ENCOUNTER — Telehealth (HOSPITAL_COMMUNITY): Payer: Self-pay

## 2015-10-26 NOTE — Telephone Encounter (Signed)
Encounter complete. 

## 2015-10-30 ENCOUNTER — Ambulatory Visit (HOSPITAL_COMMUNITY)
Admission: RE | Admit: 2015-10-30 | Discharge: 2015-10-30 | Disposition: A | Discharge status: Home / Self Care | Payer: Medicare Other | Source: Ambulatory Visit | Attending: Cardiovascular Disease | Admitting: Cardiovascular Disease

## 2015-10-30 ENCOUNTER — Encounter: Payer: Self-pay | Admitting: Cardiovascular Disease

## 2015-10-30 ENCOUNTER — Encounter (HOSPITAL_COMMUNITY): Payer: Self-pay | Admitting: *Deleted

## 2015-10-30 ENCOUNTER — Other Ambulatory Visit (HOSPITAL_COMMUNITY): Payer: Self-pay | Admitting: Internal Medicine

## 2015-10-30 ENCOUNTER — Ambulatory Visit (INDEPENDENT_AMBULATORY_CARE_PROVIDER_SITE_OTHER): Payer: Medicare Other | Admitting: Cardiovascular Disease

## 2015-10-30 VITALS — BP 168/54 | HR 92 | Ht 59.0 in

## 2015-10-30 DIAGNOSIS — E785 Hyperlipidemia, unspecified: Secondary | ICD-10-CM | POA: Diagnosis not present

## 2015-10-30 DIAGNOSIS — R0602 Shortness of breath: Secondary | ICD-10-CM

## 2015-10-30 DIAGNOSIS — R9439 Abnormal result of other cardiovascular function study: Secondary | ICD-10-CM | POA: Insufficient documentation

## 2015-10-30 DIAGNOSIS — I10 Essential (primary) hypertension: Secondary | ICD-10-CM

## 2015-10-30 LAB — EXERCISE TOLERANCE TEST
CHL CUP RESTING HR STRESS: 102 {beats}/min
CHL CUP STRESS STAGE 1 SBP: 198 mmHg
CHL CUP STRESS STAGE 2 GRADE: 0 %
CHL CUP STRESS STAGE 2 HR: 113 {beats}/min
CHL CUP STRESS STAGE 2 SPEED: 1 mph
CHL CUP STRESS STAGE 4 SPEED: 1.7 mph
CHL CUP STRESS STAGE 6 GRADE: 0 %
CHL CUP STRESS STAGE 6 SPEED: 0 mph
CHL CUP STRESS STAGE 7 GRADE: 0 %
CHL CUP STRESS STAGE 7 HR: 99 {beats}/min
CSEPED: 4 min
CSEPHR: 105 %
Estimated workload: 5.8 METS
MPHR: 154 {beats}/min
Peak HR: 146 {beats}/min
Percent of predicted max HR: 94 %
RPE: 15
Stage 1 DBP: 89 mmHg
Stage 1 Grade: 0 %
Stage 1 HR: 106 {beats}/min
Stage 1 Speed: 0 mph
Stage 3 Grade: 0 %
Stage 3 HR: 113 {beats}/min
Stage 3 Speed: 1 mph
Stage 4 DBP: 99 mmHg
Stage 4 Grade: 10 %
Stage 4 HR: 134 {beats}/min
Stage 4 SBP: 141 mmHg
Stage 5 Grade: 12 %
Stage 5 HR: 146 {beats}/min
Stage 5 Speed: 2.5 mph
Stage 6 DBP: 93 mmHg
Stage 6 HR: 134 {beats}/min
Stage 6 SBP: 186 mmHg
Stage 7 DBP: 79 mmHg
Stage 7 SBP: 201 mmHg
Stage 7 Speed: 0 mph

## 2015-10-30 NOTE — Assessment & Plan Note (Signed)
[name] is seen today as on for evaluation of an abnormal stress test. She has minimal risk factors and is otherwise a symptomatic. She has LVH on her baseline EKG with nonspecific ST and T-wave changes and with exercise had significant ST segment depression inferolaterally which persisted into recovery. I suspect this is also positive and I'm going to order an exercise Myoview stress test to further evaluate her. A recent 2-D echo performed 2 months ago was normal

## 2015-10-30 NOTE — Progress Notes (Signed)
10/30/2015 [name]   1951/07/14  [id number]  Primary Physician Thressa Sheller, MD Primary Cardiologist: Lorretta Harp MD Renae Gloss   HPI:   [name] is a 65 year old moderately overweight single Caucasian female no children who is referred by Dr. Noah Delaine exercise stress testing today because of abnormal EKG. She is formerly a patient of Dr. Lowella Fairy. Her cardiac defect profile is notable for treated hypertension and hyperlipidemia. She's never had a heart attack or stroke. She quit smoking back in 1993 and smoked 25 pack years. She is retired from working at [employer] and has been involved in horseback riding in the past. There is no family history of heart disease. Dr. Noah Delaine in noted an abnormal EKG and ordered a stress test which was performed today and was abnormal although her baseline EKG showed left ventricular hypertrophy with nonspecific ST and T-wave changes.   Current Outpatient Prescriptions  Medication Sig Dispense Refill  . amoxicillin (AMOXIL) 500 MG capsule Take 500 mg by mouth as directed.  1  . Ascorbic Acid (VITAMIN C) 1000 MG tablet Take 1,000 mg by mouth daily.      . Calcium Carbonate-Vit D-Min 600-400 MG-UNIT TABS Take 1 tablet by mouth 2 (two) times daily.      . cetirizine-pseudoephedrine (ZYRTEC-D) 5-120 MG per tablet Take 1 tablet by mouth 2 (two) times daily.     . Cholecalciferol (VITAMIN D) 2000 UNITS tablet Take 5,000 Units by mouth daily.     . Coenzyme Q10 (CO Q 10) 100 MG CAPS Take 1 capsule by mouth daily.      . Glucosamine-Chondroitin (GLUCOSAMINE CHONDR COMPLEX PO) Take 1 tablet by mouth 3 (three) times daily.      Marland Kitchen levothyroxine (SYNTHROID, LEVOTHROID) 150 MCG tablet Take 150 mcg by mouth daily with breakfast.     . metoprolol (TOPROL-XL) 50 MG 24 hr tablet Take 25-50 mg by mouth daily. 1 tablet in the morning and 0.5 tablet in the evening    . Milk Thistle 175 MG CAPS Take 1 capsule by mouth daily.      Renda Rolls AD  4-10-325 MG TABS Take 1 tablet by mouth as directed.  5  . omeprazole (PRILOSEC) 20 MG capsule Take 20 mg by mouth daily.     . rosuvastatin (CRESTOR) 20 MG tablet Take 20 mg by mouth every evening.      No current facility-administered medications for this visit.    Allergies  Allergen Reactions  . Compazine Other (See Comments)    Had when was a child-staring at the ceiling.     Social History   Social History  . Marital Status: Married    Spouse Name: N/A  . Number of Children: 0  . Years of Education: N/A   Occupational History  . [employer]   Social History Main Topics  . Smoking status: Former Smoker    Quit date: 08/14/1991  . Smokeless tobacco: Never Used  . Alcohol Use: 0.0 oz/week    1-2 Glasses of wine per week  . Drug Use: No  . Sexual Activity: Not on file   Other Topics Concern  . Not on file   Social History Narrative     Review of Systems: General: negative for chills, fever, night sweats or weight changes.  Cardiovascular: negative for chest pain, dyspnea on exertion, edema, orthopnea, palpitations, paroxysmal nocturnal dyspnea or shortness of breath Dermatological: negative for rash Respiratory: negative for cough or wheezing Urologic: negative  for hematuria Abdominal: negative for nausea, vomiting, diarrhea, bright red blood per rectum, melena, or hematemesis Neurologic: negative for visual changes, syncope, or dizziness All other systems reviewed and are otherwise negative except as noted above.    Blood pressure 168/54, pulse 92, height 4\' 11"  (1.499 m).  General appearance: alert and no distress Neck: no adenopathy, no carotid bruit, no JVD, supple, symmetrical, trachea midline and thyroid not enlarged, symmetric, no tenderness/mass/nodules Lungs: clear to auscultation bilaterally Heart: soft outflow tract murmur Extremities: extremities normal, atraumatic, no cyanosis or edema  EKG sinus tachycardia 102 with  evidence of LVH with repolarization changes. This was performed in the setting of a routine GXT  ASSESSMENT AND PLAN:   Abnormal cardiovascular stress test Ms. Becket is seen today as on for evaluation of an abnormal stress test. She has minimal risk factors and is otherwise a symptomatic. She has LVH on her baseline EKG with nonspecific ST and T-wave changes and with exercise had significant ST segment depression inferolaterally which persisted into recovery. I suspect this is also positive and I'm going to order an exercise Myoview stress test to further evaluate her. A recent 2-D echo performed 2 months ago was normal  Essential hypertension History of hypertension blood pressure measured at 168/54.Marland Kitchen She is on metoprolol. Continue current meds at current dosingno blood pressure medicines.  Hyperlipidemia History of hyperlipidemia on statin therapy followed by her PCP      Lorretta Harp MD [facility], [facility] 10/30/2015 5:17 PM

## 2015-10-30 NOTE — Assessment & Plan Note (Signed)
History of hyperlipidemia on statin therapy followed by her PCP. 

## 2015-10-30 NOTE — Patient Instructions (Addendum)
Medication Instructions:  Your physician recommends that you continue on your current medications as directed. Please refer to the Current Medication list given to you today.   Labwork: none  Testing/Procedures: Your physician has requested that you have en exercise stress myoview. For further information please visit [email or web address]. Please follow instruction sheet, as given.    Follow-Up: Pending results of your stress test.   Any Other Special Instructions Will Be Listed Below (If Applicable).     If you need a refill on your cardiac medications before your next appointment, please call your pharmacy.

## 2015-10-30 NOTE — Assessment & Plan Note (Signed)
History of hypertension blood pressure measured at 168/54.Marland Kitchen She is on metoprolol. Continue current meds at current dosingno blood pressure medicines.

## 2015-10-30 NOTE — Progress Notes (Unsigned)
Patient ID: [name], female   DOB: 1951/05/10, 65 y.o.   MRN: [id number] Dr. Claiborne Billings reviewed study and said she needed a Cardiology consult and/or Nuclear Study but he Wasn't able to see her and he didn't want her to get lost in the wait. [name] was able to have her scheduled with Dr. Gwenlyn Found today for consult. Patient was happy with the result and experience.

## 2015-10-31 ENCOUNTER — Encounter: Payer: Self-pay | Admitting: Cardiovascular Disease

## 2015-10-31 ENCOUNTER — Other Ambulatory Visit: Payer: Self-pay

## 2015-10-31 DIAGNOSIS — R9430 Abnormal result of cardiovascular function study, unspecified: Secondary | ICD-10-CM

## 2015-11-02 ENCOUNTER — Telehealth (HOSPITAL_COMMUNITY): Payer: Self-pay

## 2015-11-02 NOTE — Telephone Encounter (Signed)
Encounter complete. 

## 2015-11-07 ENCOUNTER — Ambulatory Visit (HOSPITAL_COMMUNITY)
Admission: RE | Admit: 2015-11-07 | Discharge: 2015-11-07 | Disposition: A | Discharge status: Home / Self Care | Payer: Medicare Other | Source: Ambulatory Visit | Attending: Cardiovascular Disease | Admitting: Cardiovascular Disease

## 2015-11-07 DIAGNOSIS — E669 Obesity, unspecified: Secondary | ICD-10-CM | POA: Insufficient documentation

## 2015-11-07 DIAGNOSIS — Z87891 Personal history of nicotine dependence: Secondary | ICD-10-CM | POA: Insufficient documentation

## 2015-11-07 DIAGNOSIS — R9430 Abnormal result of cardiovascular function study, unspecified: Secondary | ICD-10-CM | POA: Insufficient documentation

## 2015-11-07 DIAGNOSIS — I10 Essential (primary) hypertension: Secondary | ICD-10-CM | POA: Diagnosis not present

## 2015-11-07 DIAGNOSIS — Z6838 Body mass index (BMI) 38.0-38.9, adult: Secondary | ICD-10-CM | POA: Insufficient documentation

## 2015-11-07 DIAGNOSIS — R0609 Other forms of dyspnea: Secondary | ICD-10-CM | POA: Diagnosis not present

## 2015-11-07 MED ORDER — TECHNETIUM TC 99M SESTAMIBI GENERIC - CARDIOLITE
32.0000 | Freq: Once | INTRAVENOUS | Status: AC | PRN
  Administered 2015-11-07: 32 via INTRAVENOUS

## 2015-11-07 MED ORDER — REGADENOSON 0.4 MG/5ML IV SOLN
0.4000 mg | Freq: Once | INTRAVENOUS | Status: AC
  Administered 2015-11-07: 0.4 mg via INTRAVENOUS

## 2015-11-07 MED ORDER — TECHNETIUM TC 99M SESTAMIBI GENERIC - CARDIOLITE
10.6000 | Freq: Once | INTRAVENOUS | Status: AC | PRN
  Administered 2015-11-07: 11 via INTRAVENOUS

## 2015-11-08 LAB — MYOCARDIAL PERFUSION IMAGING
CHL CUP NUCLEAR SDS: 0
CHL CUP NUCLEAR SSS: 0
CSEPPHR: 76 {beats}/min
LV dias vol: 95 mL (ref 46–106)
LVSYSVOL: 30 mL
Rest HR: 62 {beats}/min
SRS: 0
TID: 1.29

## 2015-11-13 ENCOUNTER — Encounter: Payer: Self-pay | Admitting: Cardiovascular Disease

## 2015-11-13 NOTE — Telephone Encounter (Signed)
This encounter was created in error - please disregard.

## 2015-11-13 NOTE — Telephone Encounter (Signed)
F/u  Pt returning RN phone call- myoview results. Please call back and discuss.   

## 2015-12-11 ENCOUNTER — Ambulatory Visit (INDEPENDENT_AMBULATORY_CARE_PROVIDER_SITE_OTHER): Payer: Medicare Other

## 2015-12-11 ENCOUNTER — Encounter: Payer: Self-pay | Admitting: Podiatry

## 2015-12-11 ENCOUNTER — Ambulatory Visit (INDEPENDENT_AMBULATORY_CARE_PROVIDER_SITE_OTHER): Payer: Medicare Other | Admitting: Podiatry

## 2015-12-11 VITALS — BP 150/82 | HR 73 | Resp 16

## 2015-12-11 DIAGNOSIS — M79671 Pain in right foot: Secondary | ICD-10-CM | POA: Diagnosis not present

## 2015-12-11 DIAGNOSIS — M79672 Pain in left foot: Secondary | ICD-10-CM

## 2015-12-11 DIAGNOSIS — M205X9 Other deformities of toe(s) (acquired), unspecified foot: Secondary | ICD-10-CM

## 2015-12-11 DIAGNOSIS — M7750 Other enthesopathy of unspecified foot: Secondary | ICD-10-CM

## 2015-12-11 DIAGNOSIS — M779 Enthesopathy, unspecified: Secondary | ICD-10-CM

## 2015-12-11 DIAGNOSIS — M778 Other enthesopathies, not elsewhere classified: Secondary | ICD-10-CM

## 2015-12-11 DIAGNOSIS — M21629 Bunionette of unspecified foot: Secondary | ICD-10-CM

## 2015-12-11 NOTE — Progress Notes (Signed)
She presents today for follow-up of her tailor's bunion deformities and her capsulitis which has resolved from her last visit. She states that the dorsal aspect of the bilateral foot has become most painful and she can hardly walk with her left foot in particular. She states there is so much pain across the top that I could hardly lace my shoes.  Objective: Vital signs are stable she is alert and oriented 3 pulses are palpable. She has pain all frontal plane range of motion of the Lisfranc's joints bilateral foot. Left seems to be worse than the right. Radiographs confirm osteoarthritic changes of the midfoot bilateral.  Assessment: Capsulitis of the dorsal aspect of the bilateral foot.  Plan: Injected these areas today with dexamethasone and local anesthetic.

## 2016-01-10 ENCOUNTER — Ambulatory Visit (INDEPENDENT_AMBULATORY_CARE_PROVIDER_SITE_OTHER): Payer: Medicare Other | Admitting: Podiatry

## 2016-01-10 ENCOUNTER — Encounter: Payer: Self-pay | Admitting: Podiatry

## 2016-01-10 VITALS — BP 143/61 | HR 61 | Resp 16

## 2016-01-10 DIAGNOSIS — M778 Other enthesopathies, not elsewhere classified: Secondary | ICD-10-CM

## 2016-01-10 DIAGNOSIS — M7750 Other enthesopathy of unspecified foot: Secondary | ICD-10-CM

## 2016-01-10 DIAGNOSIS — M779 Enthesopathy, unspecified: Principal | ICD-10-CM

## 2016-01-10 NOTE — Progress Notes (Signed)
She presents today for follow-up of capsulitis of the bilateral foot. She states that she was at the point where she could no longer walk for exercise and after the injections she states that she is nearly 100% improved. She would like to know the prognosis of osteoarthritis of the mid foot.  Objective: Vital signs are stable alert and oriented 3 pulses are palpable. She has no pain on frontal plane range of motion of the midfoot. Lisfranc's joints demonstrate dorsal spurring and osteoarthritic changes and radiographs previously reviewed once again reviewed demonstrate similar findings.  Assessment: Resolving capsulitis of the midfoot bilateral. Osteoarthritic changes of the midfoot bilateral.  Plan: Discussed etiology pathology conservative versus surgical therapies. We discussed appropriate shoe gear stretching sizes ice therapy possible anti-inflammatory gel as well as injections. I will follow-up with her on an as-needed basis.

## 2016-01-24 DIAGNOSIS — M109 Gout, unspecified: Secondary | ICD-10-CM | POA: Diagnosis not present

## 2016-01-29 ENCOUNTER — Ambulatory Visit: Payer: Medicare Other | Admitting: Podiatry

## 2016-01-29 DIAGNOSIS — M19072 Primary osteoarthritis, left ankle and foot: Secondary | ICD-10-CM | POA: Diagnosis not present

## 2016-01-29 DIAGNOSIS — M19079 Primary osteoarthritis, unspecified ankle and foot: Secondary | ICD-10-CM | POA: Diagnosis not present

## 2016-01-29 DIAGNOSIS — M2580 Other specified joint disorders, unspecified joint: Secondary | ICD-10-CM | POA: Diagnosis not present

## 2016-01-29 DIAGNOSIS — M79672 Pain in left foot: Secondary | ICD-10-CM | POA: Diagnosis not present

## 2016-01-31 ENCOUNTER — Ambulatory Visit: Payer: Medicare Other | Admitting: Podiatry

## 2016-02-07 DIAGNOSIS — H2513 Age-related nuclear cataract, bilateral: Secondary | ICD-10-CM | POA: Diagnosis not present

## 2016-04-09 DIAGNOSIS — E559 Vitamin D deficiency, unspecified: Secondary | ICD-10-CM | POA: Diagnosis not present

## 2016-04-09 DIAGNOSIS — I129 Hypertensive chronic kidney disease with stage 1 through stage 4 chronic kidney disease, or unspecified chronic kidney disease: Secondary | ICD-10-CM | POA: Diagnosis not present

## 2016-04-09 DIAGNOSIS — R7309 Other abnormal glucose: Secondary | ICD-10-CM | POA: Diagnosis not present

## 2016-04-16 DIAGNOSIS — R7309 Other abnormal glucose: Secondary | ICD-10-CM | POA: Diagnosis not present

## 2016-04-16 DIAGNOSIS — Z23 Encounter for immunization: Secondary | ICD-10-CM | POA: Diagnosis not present

## 2016-04-16 DIAGNOSIS — E785 Hyperlipidemia, unspecified: Secondary | ICD-10-CM | POA: Diagnosis not present

## 2016-04-16 DIAGNOSIS — I129 Hypertensive chronic kidney disease with stage 1 through stage 4 chronic kidney disease, or unspecified chronic kidney disease: Secondary | ICD-10-CM | POA: Diagnosis not present

## 2016-05-08 DIAGNOSIS — Z01419 Encounter for gynecological examination (general) (routine) without abnormal findings: Secondary | ICD-10-CM | POA: Diagnosis not present

## 2016-05-08 DIAGNOSIS — Z1231 Encounter for screening mammogram for malignant neoplasm of breast: Secondary | ICD-10-CM | POA: Diagnosis not present

## 2016-05-12 ENCOUNTER — Other Ambulatory Visit: Payer: Self-pay | Admitting: Obstetrics and Gynecology

## 2016-05-12 DIAGNOSIS — E2839 Other primary ovarian failure: Secondary | ICD-10-CM

## 2016-07-04 IMAGING — NM NM MISC PROCEDURE
6 series · 36 of 36 positions shown · non-contrast
Comparison: none

[Series 1: wbr rest · 6.40mm/px · 6 of 64 frames shown]
[frame 6/64]
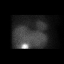
[frame 16/64]
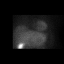
[frame 27/64]
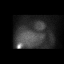
[frame 38/64]
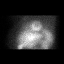
[frame 48/64]
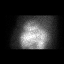
[frame 59/64]
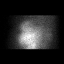

[Series 1: wbr_r-proj_st wbr rest · 6.40mm/px · 6 of 64 frames shown]
[frame 6/64]
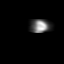
[frame 16/64]
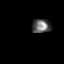
[frame 27/64]
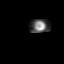
[frame 38/64]
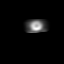
[frame 48/64]
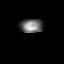
[frame 59/64]
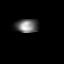

[Series 2: wbr stress-gsp · 6.40mm/px · 6 of 512 frames shown]
[frame 43/512]
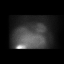
[frame 128/512]
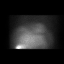
[frame 214/512]
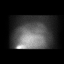
[frame 299/512]
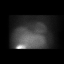
[frame 384/512]
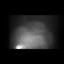
[frame 470/512]
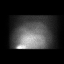

[Series 2: wbr_s-proj_st wbr stress-gsp · 6.40mm/px · 6 of 512 frames shown]
[frame 43/512]
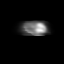
[frame 128/512]
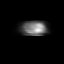
[frame 214/512]
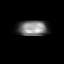
[frame 299/512]
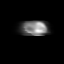
[frame 384/512]
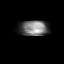
[frame 470/512]
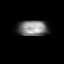

[Series 3: wbr_s-proj_st wbr stress-sum-em · 6.40mm/px · 6 of 64 frames shown]
[frame 6/64]
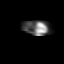
[frame 16/64]
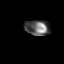
[frame 27/64]
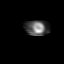
[frame 38/64]
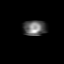
[frame 48/64]
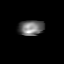
[frame 59/64]
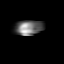

[Series 3: wbr stress-sum-em · 6.40mm/px · 6 of 64 frames shown]
[frame 6/64]
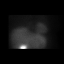
[frame 16/64]
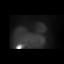
[frame 27/64]
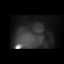
[frame 38/64]
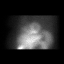
[frame 48/64]
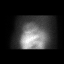
[frame 59/64]
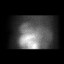

[36 of 36 positions shown; findings below may reference images not displayed]

Canned report from images found in remote index.

Refer to host system for actual result text.

## 2016-07-22 ENCOUNTER — Ambulatory Visit (INDEPENDENT_AMBULATORY_CARE_PROVIDER_SITE_OTHER): Payer: Medicare Other | Admitting: Podiatry

## 2016-07-22 ENCOUNTER — Encounter: Payer: Self-pay | Admitting: Podiatry

## 2016-07-22 DIAGNOSIS — M7750 Other enthesopathy of unspecified foot: Secondary | ICD-10-CM | POA: Diagnosis not present

## 2016-07-22 DIAGNOSIS — M778 Other enthesopathies, not elsewhere classified: Secondary | ICD-10-CM

## 2016-07-22 DIAGNOSIS — M779 Enthesopathy, unspecified: Principal | ICD-10-CM

## 2016-07-22 NOTE — Progress Notes (Signed)
She presents today for follow-up of pain and capsulitis the dorsal aspect of the bilateral foot reluctant and were about other options.  Objective: Vital signs are stable alert and oriented 3. Pulses are palpable. Neurologic sensorium is intact. Still has tenderness on palpation dorsal aspect of bilateral foot. I reviewed her old radiographs which do demonstrate some early osteoarthritic changes with capsulitis dorsal aspect of the Lisfranc's joints bilaterally.  Plan she was scanned for some orthotics today will follow up with her in the near future.

## 2016-07-25 DIAGNOSIS — M810 Age-related osteoporosis without current pathological fracture: Secondary | ICD-10-CM | POA: Diagnosis not present

## 2016-08-07 DIAGNOSIS — M7062 Trochanteric bursitis, left hip: Secondary | ICD-10-CM | POA: Diagnosis not present

## 2016-08-07 DIAGNOSIS — Z471 Aftercare following joint replacement surgery: Secondary | ICD-10-CM | POA: Diagnosis not present

## 2016-08-07 DIAGNOSIS — J329 Chronic sinusitis, unspecified: Secondary | ICD-10-CM | POA: Diagnosis not present

## 2016-08-07 DIAGNOSIS — Z96642 Presence of left artificial hip joint: Secondary | ICD-10-CM | POA: Diagnosis not present

## 2016-08-07 DIAGNOSIS — M25552 Pain in left hip: Secondary | ICD-10-CM | POA: Diagnosis not present

## 2016-08-12 ENCOUNTER — Encounter (INDEPENDENT_AMBULATORY_CARE_PROVIDER_SITE_OTHER): Payer: Medicare Other | Admitting: Podiatry

## 2016-08-12 DIAGNOSIS — M778 Other enthesopathies, not elsewhere classified: Secondary | ICD-10-CM

## 2016-08-12 DIAGNOSIS — M779 Enthesopathy, unspecified: Principal | ICD-10-CM

## 2016-08-12 NOTE — Patient Instructions (Signed)

## 2016-08-18 DIAGNOSIS — M7062 Trochanteric bursitis, left hip: Secondary | ICD-10-CM | POA: Diagnosis not present

## 2016-08-22 DIAGNOSIS — M7062 Trochanteric bursitis, left hip: Secondary | ICD-10-CM | POA: Diagnosis not present

## 2016-08-25 DIAGNOSIS — M7062 Trochanteric bursitis, left hip: Secondary | ICD-10-CM | POA: Diagnosis not present

## 2016-09-01 DIAGNOSIS — M7062 Trochanteric bursitis, left hip: Secondary | ICD-10-CM | POA: Diagnosis not present

## 2016-09-04 DIAGNOSIS — M7062 Trochanteric bursitis, left hip: Secondary | ICD-10-CM | POA: Diagnosis not present

## 2016-09-08 DIAGNOSIS — M7062 Trochanteric bursitis, left hip: Secondary | ICD-10-CM | POA: Diagnosis not present

## 2016-09-12 DIAGNOSIS — M7062 Trochanteric bursitis, left hip: Secondary | ICD-10-CM | POA: Diagnosis not present

## 2016-10-02 DIAGNOSIS — Z96642 Presence of left artificial hip joint: Secondary | ICD-10-CM | POA: Diagnosis not present

## 2016-10-02 DIAGNOSIS — Z471 Aftercare following joint replacement surgery: Secondary | ICD-10-CM | POA: Diagnosis not present

## 2016-10-15 DIAGNOSIS — I129 Hypertensive chronic kidney disease with stage 1 through stage 4 chronic kidney disease, or unspecified chronic kidney disease: Secondary | ICD-10-CM | POA: Diagnosis not present

## 2016-10-15 DIAGNOSIS — E785 Hyperlipidemia, unspecified: Secondary | ICD-10-CM | POA: Diagnosis not present

## 2016-10-15 DIAGNOSIS — E039 Hypothyroidism, unspecified: Secondary | ICD-10-CM | POA: Diagnosis not present

## 2016-10-15 DIAGNOSIS — I5032 Chronic diastolic (congestive) heart failure: Secondary | ICD-10-CM | POA: Diagnosis not present

## 2016-10-15 DIAGNOSIS — E559 Vitamin D deficiency, unspecified: Secondary | ICD-10-CM | POA: Diagnosis not present

## 2016-10-15 DIAGNOSIS — R7309 Other abnormal glucose: Secondary | ICD-10-CM | POA: Diagnosis not present

## 2016-10-15 DIAGNOSIS — Z0000 Encounter for general adult medical examination without abnormal findings: Secondary | ICD-10-CM | POA: Diagnosis not present

## 2016-10-22 DIAGNOSIS — I5032 Chronic diastolic (congestive) heart failure: Secondary | ICD-10-CM | POA: Diagnosis not present

## 2016-10-22 DIAGNOSIS — R7309 Other abnormal glucose: Secondary | ICD-10-CM | POA: Diagnosis not present

## 2016-10-22 DIAGNOSIS — Z0000 Encounter for general adult medical examination without abnormal findings: Secondary | ICD-10-CM | POA: Diagnosis not present

## 2016-10-22 DIAGNOSIS — Z23 Encounter for immunization: Secondary | ICD-10-CM | POA: Diagnosis not present

## 2016-10-22 DIAGNOSIS — N182 Chronic kidney disease, stage 2 (mild): Secondary | ICD-10-CM | POA: Diagnosis not present

## 2016-11-11 NOTE — Progress Notes (Signed)
This encounter was created in error - please disregard.

## 2016-11-13 DIAGNOSIS — Z23 Encounter for immunization: Secondary | ICD-10-CM | POA: Diagnosis not present

## 2016-12-24 DIAGNOSIS — N63 Unspecified lump in unspecified breast: Secondary | ICD-10-CM | POA: Diagnosis not present

## 2016-12-30 DIAGNOSIS — N6324 Unspecified lump in the left breast, lower inner quadrant: Secondary | ICD-10-CM | POA: Diagnosis not present

## 2017-02-17 DIAGNOSIS — H25093 Other age-related incipient cataract, bilateral: Secondary | ICD-10-CM | POA: Diagnosis not present

## 2017-03-18 DIAGNOSIS — J309 Allergic rhinitis, unspecified: Secondary | ICD-10-CM | POA: Diagnosis not present

## 2017-03-18 DIAGNOSIS — L0390 Cellulitis, unspecified: Secondary | ICD-10-CM | POA: Diagnosis not present

## 2017-04-20 DIAGNOSIS — E039 Hypothyroidism, unspecified: Secondary | ICD-10-CM | POA: Diagnosis not present

## 2017-04-20 DIAGNOSIS — E785 Hyperlipidemia, unspecified: Secondary | ICD-10-CM | POA: Diagnosis not present

## 2017-04-20 DIAGNOSIS — E559 Vitamin D deficiency, unspecified: Secondary | ICD-10-CM | POA: Diagnosis not present

## 2017-04-20 DIAGNOSIS — N390 Urinary tract infection, site not specified: Secondary | ICD-10-CM | POA: Diagnosis not present

## 2017-04-20 DIAGNOSIS — I129 Hypertensive chronic kidney disease with stage 1 through stage 4 chronic kidney disease, or unspecified chronic kidney disease: Secondary | ICD-10-CM | POA: Diagnosis not present

## 2017-04-20 DIAGNOSIS — R7309 Other abnormal glucose: Secondary | ICD-10-CM | POA: Diagnosis not present

## 2017-05-06 DIAGNOSIS — Z0000 Encounter for general adult medical examination without abnormal findings: Secondary | ICD-10-CM | POA: Diagnosis not present

## 2017-05-06 DIAGNOSIS — I129 Hypertensive chronic kidney disease with stage 1 through stage 4 chronic kidney disease, or unspecified chronic kidney disease: Secondary | ICD-10-CM | POA: Diagnosis not present

## 2017-05-06 DIAGNOSIS — R7309 Other abnormal glucose: Secondary | ICD-10-CM | POA: Diagnosis not present

## 2017-05-06 DIAGNOSIS — E785 Hyperlipidemia, unspecified: Secondary | ICD-10-CM | POA: Diagnosis not present

## 2017-05-28 DIAGNOSIS — Z124 Encounter for screening for malignant neoplasm of cervix: Secondary | ICD-10-CM | POA: Diagnosis not present

## 2017-05-28 DIAGNOSIS — Z1231 Encounter for screening mammogram for malignant neoplasm of breast: Secondary | ICD-10-CM | POA: Diagnosis not present

## 2017-05-28 DIAGNOSIS — Z01419 Encounter for gynecological examination (general) (routine) without abnormal findings: Secondary | ICD-10-CM | POA: Diagnosis not present

## 2017-10-06 ENCOUNTER — Ambulatory Visit: Payer: Medicare Other

## 2017-10-06 ENCOUNTER — Ambulatory Visit: Payer: Medicare Other | Admitting: Podiatry

## 2017-10-06 ENCOUNTER — Encounter: Payer: Self-pay | Admitting: Podiatry

## 2017-10-06 DIAGNOSIS — M7750 Other enthesopathy of unspecified foot: Secondary | ICD-10-CM

## 2017-10-06 DIAGNOSIS — M778 Other enthesopathies, not elsewhere classified: Secondary | ICD-10-CM

## 2017-10-06 DIAGNOSIS — Q828 Other specified congenital malformations of skin: Secondary | ICD-10-CM | POA: Diagnosis not present

## 2017-10-06 DIAGNOSIS — M21629 Bunionette of unspecified foot: Secondary | ICD-10-CM

## 2017-10-06 DIAGNOSIS — M779 Enthesopathy, unspecified: Principal | ICD-10-CM

## 2017-10-06 NOTE — Progress Notes (Signed)
She presents today chief complaint of painful capsulitis fifth metatarsal phalangeal joint of the right foot as well as the dorsal aspect of the right foot.  Since her left one seems to be doing pretty good.  Objective: Vital signs are stable she is alert and oriented x3.  Pulses are palpable.  Neurologic sensorium is intact.  Deep tendon reflexes are intact muscle strength is normal symmetrical bilateral.  Orthopedic evaluation demonstrates pain on palpation and range of motion of the fifth metatarsal phalangeal joint resulting in capsulitis.  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  Assessment: Capsulitis osteoarthritis right foot.  Capsulitis fifth metatarsal phalangeal joint right foot.  Plan: We discussed in great detail today present cons of injection therapy I injected the fifth metatarsal base area for bursitis with local anesthetic this is an 20 mg of Kenalog 5 mg Marcaine point maximal tenderness after sterile Betadine skin prep.  Also injected the dorsal aspect of the foot today with the same injection after sterile Betadine skin prep.  Tolerated procedure well follow-up with her on an as-needed basis.

## 2017-10-08 DIAGNOSIS — R8271 Bacteriuria: Secondary | ICD-10-CM | POA: Diagnosis not present

## 2017-10-19 DIAGNOSIS — I129 Hypertensive chronic kidney disease with stage 1 through stage 4 chronic kidney disease, or unspecified chronic kidney disease: Secondary | ICD-10-CM | POA: Diagnosis not present

## 2017-10-19 DIAGNOSIS — E785 Hyperlipidemia, unspecified: Secondary | ICD-10-CM | POA: Diagnosis not present

## 2017-10-19 DIAGNOSIS — N390 Urinary tract infection, site not specified: Secondary | ICD-10-CM | POA: Diagnosis not present

## 2017-10-19 DIAGNOSIS — E039 Hypothyroidism, unspecified: Secondary | ICD-10-CM | POA: Diagnosis not present

## 2017-10-19 DIAGNOSIS — R7309 Other abnormal glucose: Secondary | ICD-10-CM | POA: Diagnosis not present

## 2017-10-26 DIAGNOSIS — I129 Hypertensive chronic kidney disease with stage 1 through stage 4 chronic kidney disease, or unspecified chronic kidney disease: Secondary | ICD-10-CM | POA: Diagnosis not present

## 2017-10-26 DIAGNOSIS — R7309 Other abnormal glucose: Secondary | ICD-10-CM | POA: Diagnosis not present

## 2017-10-26 DIAGNOSIS — N182 Chronic kidney disease, stage 2 (mild): Secondary | ICD-10-CM | POA: Diagnosis not present

## 2017-10-26 DIAGNOSIS — Z0000 Encounter for general adult medical examination without abnormal findings: Secondary | ICD-10-CM | POA: Diagnosis not present

## 2017-10-26 DIAGNOSIS — E785 Hyperlipidemia, unspecified: Secondary | ICD-10-CM | POA: Diagnosis not present

## 2017-10-26 DIAGNOSIS — I5032 Chronic diastolic (congestive) heart failure: Secondary | ICD-10-CM | POA: Diagnosis not present

## 2018-02-23 DIAGNOSIS — H2513 Age-related nuclear cataract, bilateral: Secondary | ICD-10-CM | POA: Diagnosis not present

## 2018-04-19 DIAGNOSIS — E785 Hyperlipidemia, unspecified: Secondary | ICD-10-CM | POA: Diagnosis not present

## 2018-04-19 DIAGNOSIS — N182 Chronic kidney disease, stage 2 (mild): Secondary | ICD-10-CM | POA: Diagnosis not present

## 2018-04-19 DIAGNOSIS — I5032 Chronic diastolic (congestive) heart failure: Secondary | ICD-10-CM | POA: Diagnosis not present

## 2018-04-26 DIAGNOSIS — I129 Hypertensive chronic kidney disease with stage 1 through stage 4 chronic kidney disease, or unspecified chronic kidney disease: Secondary | ICD-10-CM | POA: Diagnosis not present

## 2018-04-26 DIAGNOSIS — E785 Hyperlipidemia, unspecified: Secondary | ICD-10-CM | POA: Diagnosis not present

## 2018-04-26 DIAGNOSIS — N182 Chronic kidney disease, stage 2 (mild): Secondary | ICD-10-CM | POA: Diagnosis not present

## 2018-04-26 DIAGNOSIS — I5032 Chronic diastolic (congestive) heart failure: Secondary | ICD-10-CM | POA: Diagnosis not present

## 2018-04-26 DIAGNOSIS — Z23 Encounter for immunization: Secondary | ICD-10-CM | POA: Diagnosis not present

## 2018-04-26 DIAGNOSIS — R7309 Other abnormal glucose: Secondary | ICD-10-CM | POA: Diagnosis not present

## 2018-06-11 DIAGNOSIS — Z1231 Encounter for screening mammogram for malignant neoplasm of breast: Secondary | ICD-10-CM | POA: Diagnosis not present

## 2018-06-15 ENCOUNTER — Other Ambulatory Visit: Payer: Self-pay | Admitting: Obstetrics and Gynecology

## 2018-06-15 DIAGNOSIS — R928 Other abnormal and inconclusive findings on diagnostic imaging of breast: Secondary | ICD-10-CM

## 2018-06-18 ENCOUNTER — Ambulatory Visit
Admission: RE | Admit: 2018-06-18 | Discharge: 2018-06-18 | Disposition: A | Discharge status: Home / Self Care | Payer: Medicare Other | Source: Ambulatory Visit | Attending: Obstetrics and Gynecology | Admitting: Obstetrics and Gynecology

## 2018-06-18 ENCOUNTER — Other Ambulatory Visit: Payer: Self-pay | Admitting: Obstetrics and Gynecology

## 2018-06-18 DIAGNOSIS — R928 Other abnormal and inconclusive findings on diagnostic imaging of breast: Secondary | ICD-10-CM

## 2018-06-18 DIAGNOSIS — R2232 Localized swelling, mass and lump, left upper limb: Secondary | ICD-10-CM

## 2018-06-18 DIAGNOSIS — N6489 Other specified disorders of breast: Secondary | ICD-10-CM | POA: Diagnosis not present

## 2018-06-24 ENCOUNTER — Ambulatory Visit
Admission: RE | Admit: 2018-06-24 | Discharge: 2018-06-24 | Disposition: A | Discharge status: Home / Self Care | Payer: Medicare Other | Source: Ambulatory Visit | Attending: Obstetrics and Gynecology | Admitting: Obstetrics and Gynecology

## 2018-06-24 ENCOUNTER — Other Ambulatory Visit (HOSPITAL_COMMUNITY)
Admission: RE | Admit: 2018-06-24 | Discharge: 2018-06-24 | Disposition: A | Discharge status: Home / Self Care | Payer: Medicare Other | Source: Ambulatory Visit | Attending: Diagnostic Radiology | Admitting: Diagnostic Radiology

## 2018-06-24 DIAGNOSIS — R2232 Localized swelling, mass and lump, left upper limb: Secondary | ICD-10-CM

## 2018-06-24 DIAGNOSIS — R590 Localized enlarged lymph nodes: Secondary | ICD-10-CM | POA: Diagnosis not present

## 2018-10-28 DIAGNOSIS — N182 Chronic kidney disease, stage 2 (mild): Secondary | ICD-10-CM | POA: Diagnosis not present

## 2018-10-28 DIAGNOSIS — I5032 Chronic diastolic (congestive) heart failure: Secondary | ICD-10-CM | POA: Diagnosis not present

## 2018-10-28 DIAGNOSIS — R7309 Other abnormal glucose: Secondary | ICD-10-CM | POA: Diagnosis not present

## 2018-10-28 DIAGNOSIS — I129 Hypertensive chronic kidney disease with stage 1 through stage 4 chronic kidney disease, or unspecified chronic kidney disease: Secondary | ICD-10-CM | POA: Diagnosis not present

## 2018-10-28 DIAGNOSIS — N390 Urinary tract infection, site not specified: Secondary | ICD-10-CM | POA: Diagnosis not present

## 2018-11-04 DIAGNOSIS — E785 Hyperlipidemia, unspecified: Secondary | ICD-10-CM | POA: Diagnosis not present

## 2018-11-04 DIAGNOSIS — E039 Hypothyroidism, unspecified: Secondary | ICD-10-CM | POA: Diagnosis not present

## 2018-11-04 DIAGNOSIS — I129 Hypertensive chronic kidney disease with stage 1 through stage 4 chronic kidney disease, or unspecified chronic kidney disease: Secondary | ICD-10-CM | POA: Diagnosis not present

## 2018-11-04 DIAGNOSIS — R7309 Other abnormal glucose: Secondary | ICD-10-CM | POA: Diagnosis not present

## 2018-11-04 DIAGNOSIS — Z0000 Encounter for general adult medical examination without abnormal findings: Secondary | ICD-10-CM | POA: Diagnosis not present

## 2019-01-06 DIAGNOSIS — J309 Allergic rhinitis, unspecified: Secondary | ICD-10-CM | POA: Diagnosis not present

## 2019-01-06 DIAGNOSIS — J301 Allergic rhinitis due to pollen: Secondary | ICD-10-CM | POA: Diagnosis not present

## 2019-03-11 DIAGNOSIS — H2513 Age-related nuclear cataract, bilateral: Secondary | ICD-10-CM | POA: Diagnosis not present

## 2019-04-14 DIAGNOSIS — J301 Allergic rhinitis due to pollen: Secondary | ICD-10-CM | POA: Diagnosis not present

## 2019-04-14 DIAGNOSIS — J309 Allergic rhinitis, unspecified: Secondary | ICD-10-CM | POA: Diagnosis not present

## 2019-04-14 DIAGNOSIS — Z23 Encounter for immunization: Secondary | ICD-10-CM | POA: Diagnosis not present

## 2019-06-16 DIAGNOSIS — E785 Hyperlipidemia, unspecified: Secondary | ICD-10-CM | POA: Diagnosis not present

## 2019-06-23 DIAGNOSIS — R7309 Other abnormal glucose: Secondary | ICD-10-CM | POA: Diagnosis not present

## 2019-06-23 DIAGNOSIS — E039 Hypothyroidism, unspecified: Secondary | ICD-10-CM | POA: Diagnosis not present

## 2019-06-23 DIAGNOSIS — E785 Hyperlipidemia, unspecified: Secondary | ICD-10-CM | POA: Diagnosis not present

## 2019-06-23 DIAGNOSIS — I129 Hypertensive chronic kidney disease with stage 1 through stage 4 chronic kidney disease, or unspecified chronic kidney disease: Secondary | ICD-10-CM | POA: Diagnosis not present

## 2019-07-13 DIAGNOSIS — Z1231 Encounter for screening mammogram for malignant neoplasm of breast: Secondary | ICD-10-CM | POA: Diagnosis not present

## 2019-09-01 ENCOUNTER — Ambulatory Visit: Payer: Medicare Other | Attending: Internal Medicine

## 2019-09-01 DIAGNOSIS — Z23 Encounter for immunization: Secondary | ICD-10-CM | POA: Insufficient documentation

## 2019-09-01 NOTE — Progress Notes (Signed)
   Covid-19 Vaccination Clinic  Name:  [name]    MRN: [id number] DOB: 10-09-1950  09/01/2019  Ms. [name] was observed post Covid-19 immunization for 15 minutes without incidence. She was provided with Vaccine Information Sheet and instruction to access the V-Safe system.   Ms. [name] was instructed to call 911 with any severe reactions post vaccine: Marland Kitchen Difficulty breathing  . Swelling of your face and throat  . A fast heartbeat  . A bad rash all over your body  . Dizziness and weakness    Immunizations Administered    Name Date Dose VIS Date Route   Pfizer COVID-19 Vaccine 09/01/2019  9:51 AM 0.3 mL 07/22/2019 Intramuscular   Manufacturer: [place]   Lot: [id number]   [place]: [id number]

## 2019-09-22 ENCOUNTER — Ambulatory Visit: Payer: Medicare Other

## 2019-09-22 ENCOUNTER — Ambulatory Visit: Payer: Medicare Other | Attending: Internal Medicine

## 2019-09-22 DIAGNOSIS — Z23 Encounter for immunization: Secondary | ICD-10-CM | POA: Insufficient documentation

## 2019-09-22 NOTE — Progress Notes (Signed)
   Covid-19 Vaccination Clinic  Name:  [name]    MRN: [id number] DOB: Feb 07, 1951  09/22/2019  [name] was observed post Covid-19 immunization for 15 minutes without incidence. She was provided with Vaccine Information Sheet and instruction to access the V-Safe system.   [name] was instructed to call 911 with any severe reactions post vaccine: Marland Kitchen Difficulty breathing  . Swelling of your face and throat  . A fast heartbeat  . A bad rash all over your body  . Dizziness and weakness    Immunizations Administered    Name Date Dose VIS Date Route   Pfizer COVID-19 Vaccine 09/22/2019  9:41 AM 0.3 mL 07/22/2019 Intramuscular   Manufacturer: [place]   Lot: [id number]   [place]: [id number]

## 2019-09-27 DIAGNOSIS — J309 Allergic rhinitis, unspecified: Secondary | ICD-10-CM | POA: Diagnosis not present

## 2019-11-10 DIAGNOSIS — E039 Hypothyroidism, unspecified: Secondary | ICD-10-CM | POA: Diagnosis not present

## 2019-11-10 DIAGNOSIS — Z0000 Encounter for general adult medical examination without abnormal findings: Secondary | ICD-10-CM | POA: Diagnosis not present

## 2019-11-10 DIAGNOSIS — R7309 Other abnormal glucose: Secondary | ICD-10-CM | POA: Diagnosis not present

## 2019-11-10 DIAGNOSIS — I129 Hypertensive chronic kidney disease with stage 1 through stage 4 chronic kidney disease, or unspecified chronic kidney disease: Secondary | ICD-10-CM | POA: Diagnosis not present

## 2019-11-10 DIAGNOSIS — E785 Hyperlipidemia, unspecified: Secondary | ICD-10-CM | POA: Diagnosis not present

## 2019-11-10 DIAGNOSIS — N390 Urinary tract infection, site not specified: Secondary | ICD-10-CM | POA: Diagnosis not present

## 2019-11-17 DIAGNOSIS — E785 Hyperlipidemia, unspecified: Secondary | ICD-10-CM | POA: Diagnosis not present

## 2019-11-17 DIAGNOSIS — I129 Hypertensive chronic kidney disease with stage 1 through stage 4 chronic kidney disease, or unspecified chronic kidney disease: Secondary | ICD-10-CM | POA: Diagnosis not present

## 2019-11-17 DIAGNOSIS — E039 Hypothyroidism, unspecified: Secondary | ICD-10-CM | POA: Diagnosis not present

## 2019-11-17 DIAGNOSIS — R7309 Other abnormal glucose: Secondary | ICD-10-CM | POA: Diagnosis not present

## 2019-11-17 DIAGNOSIS — Z0000 Encounter for general adult medical examination without abnormal findings: Secondary | ICD-10-CM | POA: Diagnosis not present

## 2019-12-20 ENCOUNTER — Encounter: Payer: Self-pay | Admitting: Gastroenterology

## 2020-02-07 ENCOUNTER — Other Ambulatory Visit: Payer: Self-pay

## 2020-02-07 ENCOUNTER — Ambulatory Visit (AMBULATORY_SURGERY_CENTER): Payer: Self-pay | Admitting: *Deleted

## 2020-02-07 VITALS — Ht 59.0 in | Wt 170.0 lb

## 2020-02-07 DIAGNOSIS — Z86010 Personal history of colonic polyps: Secondary | ICD-10-CM

## 2020-02-07 MED ORDER — SUTAB 1479-225-188 MG PO TABS: 24.0000 | ORAL_TABLET | ORAL | 0 refills | Status: AC

## 2020-02-07 NOTE — Progress Notes (Signed)
09-22-19 comp covid vaccines  No egg or soy allergy known to patient   issues with past sedation with any surgeries  or procedures PONV , no intubation problems  No diet pills per patient No home 02 use per patient  No blood thinners per patient  Pt denies issues with constipation  No A fib or A flutter  EMMI video sent to pt's e mail  COVID 19 guidelines implemented in [place] today   Due to the COVID-19 pandemic we are asking patients to follow these guidelines. Please only bring one care partner. Please be aware that your care partner may wait in the car in the parking lot or if they feel like they will be too hot to wait in the car, they may wait in the lobby on the 4th floor. All care partners are required to wear a mask the entire time (we do not have any that we can provide them), they need to practice social distancing, and we will do a Covid check for all patient's and care partners when you arrive. Also we will check their temperature and your temperature. If the care partner waits in their car they need to stay in the parking lot the entire time and we will call them on their cell phone when the patient is ready for discharge so they can bring the car to the front of the building. Also all patient's will need to wear a mask into building.

## 2020-02-08 ENCOUNTER — Encounter: Payer: Self-pay | Admitting: Gastroenterology

## 2020-02-21 ENCOUNTER — Ambulatory Visit (AMBULATORY_SURGERY_CENTER): Payer: Medicare Other | Admitting: Gastroenterology

## 2020-02-21 ENCOUNTER — Other Ambulatory Visit: Payer: Self-pay

## 2020-02-21 ENCOUNTER — Encounter: Payer: Self-pay | Admitting: Gastroenterology

## 2020-02-21 VITALS — BP 134/60 | HR 67 | Temp 97.7°F | Resp 21 | Ht 59.0 in | Wt 170.0 lb

## 2020-02-21 DIAGNOSIS — Z86010 Personal history of colonic polyps: Secondary | ICD-10-CM | POA: Diagnosis not present

## 2020-02-21 DIAGNOSIS — K621 Rectal polyp: Secondary | ICD-10-CM

## 2020-02-21 DIAGNOSIS — D128 Benign neoplasm of rectum: Secondary | ICD-10-CM | POA: Diagnosis not present

## 2020-02-21 MED ORDER — SODIUM CHLORIDE 0.9 % IV SOLN: 500.0000 mL | Freq: Once | INTRAVENOUS | Status: DC

## 2020-02-21 NOTE — Progress Notes (Signed)
pt tolerated well. VSS. awake and to recovery. Report given to RN.  

## 2020-02-21 NOTE — Progress Notes (Signed)
Called to room to assist during endoscopic procedure.  Patient ID and intended procedure confirmed with present staff. Received instructions for my participation in the procedure from the performing physician.  

## 2020-02-21 NOTE — Patient Instructions (Addendum)
Handouts were given to you on polyps, diverticulosis, and hemorrhoids. You may resume your current medications today. Await biopsy results that usually takes 2-3 weeks.  Repeat colonoscopy in 5 years for surveillance based on the pathology results. Please call if any questions or concerns.      YOU HAD AN ENDOSCOPIC PROCEDURE TODAY AT [facility]:   Refer to the procedure report that was given to you for any specific questions about what was found during the examination.  If the procedure report does not answer your questions, please call your gastroenterologist to clarify.  If you requested that your care partner not be given the details of your procedure findings, then the procedure report has been included in a sealed envelope for you to review at your convenience later.  YOU SHOULD EXPECT: Some feelings of bloating in the abdomen. Passage of more gas than usual.  Walking can help get rid of the air that was put into your GI tract during the procedure and reduce the bloating. If you had a lower endoscopy (such as a colonoscopy or flexible sigmoidoscopy) you may notice spotting of blood in your stool or on the toilet paper. If you underwent a bowel prep for your procedure, you may not have a normal bowel movement for a few days.  Please Note:  You might notice some irritation and congestion in your nose or some drainage.  This is from the oxygen used during your procedure.  There is no need for concern and it should clear up in a day or so.  SYMPTOMS TO REPORT IMMEDIATELY:   Following lower endoscopy (colonoscopy or flexible sigmoidoscopy):  Excessive amounts of blood in the stool  Significant tenderness or worsening of abdominal pains  Swelling of the abdomen that is new, acute  Fever of 100F or higher  s  For urgent or emergent issues, a gastroenterologist can be reached at any hour by calling [phone]. Do not use MyChart messaging for urgent concerns.     DIET:  We do recommend a small meal at first, but then you may proceed to your regular diet.  Drink plenty of fluids but you should avoid alcoholic beverages for 24 hours.  ACTIVITY:  You should plan to take it easy for the rest of today and you should NOT DRIVE or use heavy machinery until tomorrow (because of the sedation medicines used during the test).    FOLLOW UP: Our staff will call the number listed on your records 48-72 hours following your procedure to check on you and address any questions or concerns that you may have regarding the information given to you following your procedure. If we do not reach you, we will leave a message.  We will attempt to reach you two times.  During this call, we will ask if you have developed any symptoms of COVID 19. If you develop any symptoms (ie: fever, flu-like symptoms, shortness of breath, cough etc.) before then, please call [phone].  If you test positive for Covid 19 in the 2 weeks post procedure, please call and report this information to [place].    If any biopsies were taken you will be contacted by phone or by letter within the next 1-3 weeks.  Please call us at [phone] if you have not heard about the biopsies in 3 weeks.    SIGNATURES/CONFIDENTIALITY: You and/or your care partner have signed paperwork which will be entered into your electronic medical record.  These signatures attest to the  fact that that the information above on your After Visit Summary has been reviewed and is understood.  Full responsibility of the confidentiality of this discharge information lies with you and/or your care-partner.

## 2020-02-21 NOTE — Progress Notes (Addendum)
No problems noted in the recovery room. Maw  Pt's mask was broken, I gave pt a new mask to wear. Maw  Pt had a small hematoma on top of her right hand.  No bleeding noted now.  I applies a new dressing to site and held pressure.  I gave pt a warm pack to apply to site to aid swelling.  I instructed pt to throw the warm pack away after it cools down.  Not to reuse.  maw

## 2020-02-21 NOTE — Progress Notes (Signed)
Pt's states no medical or surgical changes since previsit or office visit.   V/s-cw  Check-in-jb 

## 2020-02-21 NOTE — Op Note (Signed)
[facility] Patient Name: [name] Procedure Date: 02/21/2020 10:58 AM MRN: [id number] Endoscopist: Mauri Pole , MD Age: 69 Referring MD:  Date of Birth: [birth year] Gender: Female Account #: [id number] Procedure:                Colonoscopy Indications:              High risk colon cancer surveillance: Personal                            history of colonic polyps, High risk colon cancer                            surveillance: Personal history of adenoma (10 mm or                            greater in size) Medicines:                Monitored Anesthesia Care Procedure:                Pre-Anesthesia Assessment:                           - Prior to the procedure, a History and Physical                            was performed, and patient medications and                            allergies were reviewed. The patient's tolerance of                            previous anesthesia was also reviewed. The risks                            and benefits of the procedure and the sedation                            options and risks were discussed with the patient.                            All questions were answered, and informed consent                            was obtained. Prior Anticoagulants: The patient has                            taken no previous anticoagulant or antiplatelet                            agents. ASA Grade Assessment: III - A patient with                            severe systemic disease. After reviewing the risks  and benefits, the patient was deemed in                            satisfactory condition to undergo the procedure.                           After obtaining informed consent, the colonoscope                            was passed under direct vision. Throughout the                            procedure, the patient's blood pressure, pulse, and                            oxygen saturations were monitored  continuously. The                            Colonoscope was introduced through the anus and                            advanced to the the cecum, identified by                            appendiceal orifice and ileocecal valve. The                            colonoscopy was performed without difficulty. The                            patient tolerated the procedure well. The quality                            of the bowel preparation was adequate. The                            ileocecal valve, appendiceal orifice, and rectum                            were photographed. Scope In: 11:13:03 AM Scope Out: 11:30:30 AM Scope Withdrawal Time: 0 hours 10 minutes 43 seconds  Total Procedure Duration: 0 hours 17 minutes 27 seconds  Findings:                 The perianal and digital rectal examinations were                            normal.                           Two sessile polyps were found in the rectum. The                            polyps were 4 to 5 mm in size. These polyps were  removed with a cold snare. Resection and retrieval                            were complete.                           Multiple small and large-mouthed diverticula were                            found in the sigmoid colon and descending colon.                            There was narrowing of the colon in association                            with the diverticular opening. There was evidence                            of diverticular spasm.                           Non-bleeding internal hemorrhoids were found during                            retroflexion. The hemorrhoids were medium-sized. Complications:            No immediate complications. Estimated Blood Loss:     Estimated blood loss was minimal. Impression:               - Two 4 to 5 mm polyps in the rectum, removed with                            a cold snare. Resected and retrieved.                           - Moderate  diverticulosis in the sigmoid colon and                            in the descending colon. There was narrowing of the                            colon in association with the diverticular opening.                            There was evidence of diverticular spasm.                           - Non-bleeding internal hemorrhoids. Recommendation:           - Patient has a contact number available for                            emergencies. The signs and symptoms of potential  delayed complications were discussed with the                            patient. Return to normal activities tomorrow.                            Written discharge instructions were provided to the                            patient.                           - Resume previous diet.                           - Continue present medications.                           - Await pathology results.                           - Repeat colonoscopy in 5 years for surveillance                            based on pathology results. Mauri Pole, MD 02/21/2020 11:34:30 AM This report has been signed electronically.

## 2020-02-23 ENCOUNTER — Telehealth: Payer: Self-pay | Admitting: *Deleted

## 2020-02-23 ENCOUNTER — Telehealth: Payer: Self-pay

## 2020-02-23 DIAGNOSIS — M79671 Pain in right foot: Secondary | ICD-10-CM | POA: Diagnosis not present

## 2020-02-23 DIAGNOSIS — M19071 Primary osteoarthritis, right ankle and foot: Secondary | ICD-10-CM | POA: Diagnosis not present

## 2020-02-23 DIAGNOSIS — M79674 Pain in right toe(s): Secondary | ICD-10-CM | POA: Diagnosis not present

## 2020-02-23 NOTE — Telephone Encounter (Signed)
Called twice and patient hung up both times without saying anything.  Will try back again after lunch.

## 2020-02-23 NOTE — Telephone Encounter (Signed)
  Follow up Call-  Call back number 02/21/2020  Post procedure Call Back phone  # [phone]  Permission to leave phone message Yes  Some recent data might be hidden     Left message

## 2020-02-28 ENCOUNTER — Ambulatory Visit: Payer: Medicare Other | Admitting: Podiatry

## 2020-02-28 ENCOUNTER — Encounter: Payer: Self-pay | Admitting: Podiatry

## 2020-02-28 ENCOUNTER — Other Ambulatory Visit: Payer: Self-pay

## 2020-02-28 ENCOUNTER — Ambulatory Visit (INDEPENDENT_AMBULATORY_CARE_PROVIDER_SITE_OTHER): Payer: Medicare Other

## 2020-02-28 DIAGNOSIS — M778 Other enthesopathies, not elsewhere classified: Secondary | ICD-10-CM | POA: Diagnosis not present

## 2020-02-28 DIAGNOSIS — M109 Gout, unspecified: Secondary | ICD-10-CM

## 2020-02-29 NOTE — Progress Notes (Signed)
She presents today she took a chief complaint of a painful toe second right.  States that is red and swollen for the past 5 days saw her PCP evaluated was not really sure what it was prescribed meloxicam it has gotten better to some degree denies any injury.  She does go on to say that she was in the final day of her colon prep for her colonoscopy when she started to develop the pain.  She states that she woke up that morning and was painful when onto her colonoscopy and by the time she was done with the colonoscopy the toe was so red and painful she could not even walk on the foot she had to walk on her heels when she came home.  Objective: Vital signs are stable she is alert and oriented x3.  Pulses are palpable.  She has erythema and edema to the DIPJ of the second toe right foot.  There is no open lesions or wounds noted.  It is painful on palpation but to a much less degree than when she related.  Radiographs taken today demonstrate some osteoarthritic changes there what appears to be an old lateral collateral ligament avulsion possibly is located there but does not demonstrate anything new.  Assessment: Probable gout attack possibly associated with her colon prep.  And dehydration  Plan: Since this is improving I encouraged her to continue the meloxicam.  I related to her that she should come in at any point this becomes painful and red.  Otherwise follow-up with [place] as needed.

## 2020-03-01 ENCOUNTER — Encounter: Payer: Self-pay | Admitting: Gastroenterology

## 2020-03-12 DIAGNOSIS — H35033 Hypertensive retinopathy, bilateral: Secondary | ICD-10-CM | POA: Diagnosis not present

## 2020-04-12 ENCOUNTER — Encounter: Payer: Self-pay | Admitting: Podiatry

## 2020-04-12 ENCOUNTER — Other Ambulatory Visit: Payer: Self-pay

## 2020-04-12 ENCOUNTER — Ambulatory Visit: Payer: Medicare Other | Admitting: Podiatry

## 2020-04-12 DIAGNOSIS — M1A0790 Idiopathic chronic gout, unspecified ankle and foot, without tophus (tophi): Secondary | ICD-10-CM | POA: Diagnosis not present

## 2020-04-12 MED ORDER — METHYLPREDNISOLONE 4 MG PO TBPK
ORAL_TABLET | ORAL | 0 refills | Status: AC
Start: 2020-04-12 — End: ?

## 2020-04-12 NOTE — Progress Notes (Signed)
She presents today states that I think about another gout attack bilaterally.  States that over a few weeks ago and developed and is much better today than it was previously.  States that the second toe on the right foot is really giving me if it and the whole foot has been swollen but appears to be doing better.  She is confirms that she is still taking her hydrochlorothiazide.  Objective: Vital signs are stable she is alert and oriented x3.  Pulses are palpable.  He has moderate edema to the bilateral forefoot second digit of the right foot demonstrates erythema and warmth on palpation as well as pain.  Most of is located the DIPJ.  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  I will follow-up with her in a few weeks.  I will start her on a Medrol Dosepak and we may need to discuss discontinuing the hydrochlorothiazide.

## 2020-04-14 LAB — CBC WITH DIFFERENTIAL/PLATELET
Absolute Monocytes: 692 cells/uL (ref 200–950)
Basophils Absolute: 64 cells/uL (ref 0–200)
Basophils Relative: 0.7 %
Eosinophils Absolute: 209 cells/uL (ref 15–500)
Eosinophils Relative: 2.3 %
HCT: 37 % (ref 35.0–45.0)
Hemoglobin: 12.4 g/dL (ref 11.7–15.5)
Lymphs Abs: 2111 cells/uL (ref 850–3900)
MCH: 30.2 pg (ref 27.0–33.0)
MCHC: 33.5 g/dL (ref 32.0–36.0)
MCV: 90 fL (ref 80.0–100.0)
MPV: 9.9 fL (ref 7.5–12.5)
Monocytes Relative: 7.6 %
Neutro Abs: 6024 cells/uL (ref 1500–7800)
Neutrophils Relative %: 66.2 %
Platelets: 360 10*3/uL (ref 140–400)
RBC: 4.11 10*6/uL (ref 3.80–5.10)
RDW: 12.1 % (ref 11.0–15.0)
Total Lymphocyte: 23.2 %
WBC: 9.1 10*3/uL (ref 3.8–10.8)

## 2020-04-14 LAB — SEDIMENTATION RATE: Sed Rate: 41 mm/h — ABNORMAL HIGH (ref 0–30)

## 2020-04-14 LAB — COMPREHENSIVE METABOLIC PANEL
AG Ratio: 1.6 (calc) (ref 1.0–2.5)
ALT: 9 U/L (ref 6–29)
AST: 13 U/L (ref 10–35)
Albumin: 4 g/dL (ref 3.6–5.1)
Alkaline phosphatase (APISO): 74 U/L (ref 37–153)
BUN/Creatinine Ratio: 23 (calc) — ABNORMAL HIGH (ref 6–22)
BUN: 12 mg/dL (ref 7–25)
CO2: 25 mmol/L (ref 20–32)
Calcium: 9.3 mg/dL (ref 8.6–10.4)
Chloride: 105 mmol/L (ref 98–110)
Creat: 0.52 mg/dL — ABNORMAL LOW (ref 0.60–0.93)
Globulin: 2.5 g/dL (calc) (ref 1.9–3.7)
Glucose, Bld: 103 mg/dL (ref 65–139)
Potassium: 4.4 mmol/L (ref 3.5–5.3)
Sodium: 141 mmol/L (ref 135–146)
Total Bilirubin: 0.3 mg/dL (ref 0.2–1.2)
Total Protein: 6.5 g/dL (ref 6.1–8.1)

## 2020-04-14 LAB — ANTI-NUCLEAR AB-TITER (ANA TITER): ANA Titer 1: 1:40 {titer} — ABNORMAL HIGH

## 2020-04-14 LAB — ANA: Anti Nuclear Antibody (ANA): POSITIVE — AB

## 2020-04-14 LAB — URIC ACID: Uric Acid, Serum: 6.1 mg/dL (ref 2.5–7.0)

## 2020-04-14 LAB — RHEUMATOID FACTOR: Rheumatoid fact SerPl-aCnc: <14 IU/mL (ref <14)

## 2020-04-14 LAB — C-REACTIVE PROTEIN: CRP: 13.1 mg/L — ABNORMAL HIGH (ref <8.0)

## 2020-04-18 ENCOUNTER — Telehealth: Payer: Self-pay | Admitting: *Deleted

## 2020-04-18 DIAGNOSIS — M109 Gout, unspecified: Secondary | ICD-10-CM

## 2020-04-18 NOTE — Telephone Encounter (Signed)
Referral to Rheumatology sent

## 2020-04-18 NOTE — Telephone Encounter (Signed)
-----   Message from Garrel Ridgel, [place] sent at 04/18/2020  7:21 AM EDT ----- She needs to be seen by Rheumatology to r/o arthritis and ANA

## 2020-05-31 DIAGNOSIS — E039 Hypothyroidism, unspecified: Secondary | ICD-10-CM | POA: Diagnosis not present

## 2020-05-31 DIAGNOSIS — E785 Hyperlipidemia, unspecified: Secondary | ICD-10-CM | POA: Diagnosis not present

## 2020-06-06 DIAGNOSIS — E039 Hypothyroidism, unspecified: Secondary | ICD-10-CM | POA: Diagnosis not present

## 2020-06-06 DIAGNOSIS — E559 Vitamin D deficiency, unspecified: Secondary | ICD-10-CM | POA: Diagnosis not present

## 2020-06-06 DIAGNOSIS — E785 Hyperlipidemia, unspecified: Secondary | ICD-10-CM | POA: Diagnosis not present

## 2020-06-06 DIAGNOSIS — I129 Hypertensive chronic kidney disease with stage 1 through stage 4 chronic kidney disease, or unspecified chronic kidney disease: Secondary | ICD-10-CM | POA: Diagnosis not present

## 2020-06-06 DIAGNOSIS — R7309 Other abnormal glucose: Secondary | ICD-10-CM | POA: Diagnosis not present

## 2020-09-08 DIAGNOSIS — I10 Essential (primary) hypertension: Secondary | ICD-10-CM | POA: Diagnosis not present

## 2020-09-08 DIAGNOSIS — E785 Hyperlipidemia, unspecified: Secondary | ICD-10-CM | POA: Diagnosis not present

## 2020-09-08 DIAGNOSIS — E039 Hypothyroidism, unspecified: Secondary | ICD-10-CM | POA: Diagnosis not present

## 2020-10-16 DIAGNOSIS — B372 Candidiasis of skin and nail: Secondary | ICD-10-CM | POA: Diagnosis not present

## 2020-10-16 DIAGNOSIS — R21 Rash and other nonspecific skin eruption: Secondary | ICD-10-CM | POA: Diagnosis not present

## 2020-10-16 DIAGNOSIS — L299 Pruritus, unspecified: Secondary | ICD-10-CM | POA: Diagnosis not present

## 2020-11-08 DIAGNOSIS — I10 Essential (primary) hypertension: Secondary | ICD-10-CM | POA: Diagnosis not present

## 2020-11-08 DIAGNOSIS — E039 Hypothyroidism, unspecified: Secondary | ICD-10-CM | POA: Diagnosis not present

## 2020-11-08 DIAGNOSIS — E785 Hyperlipidemia, unspecified: Secondary | ICD-10-CM | POA: Diagnosis not present

## 2020-11-19 DIAGNOSIS — L299 Pruritus, unspecified: Secondary | ICD-10-CM | POA: Diagnosis not present

## 2020-11-19 DIAGNOSIS — R21 Rash and other nonspecific skin eruption: Secondary | ICD-10-CM | POA: Diagnosis not present

## 2020-11-19 DIAGNOSIS — B372 Candidiasis of skin and nail: Secondary | ICD-10-CM | POA: Diagnosis not present

## 2020-12-08 DIAGNOSIS — E039 Hypothyroidism, unspecified: Secondary | ICD-10-CM | POA: Diagnosis not present

## 2020-12-08 DIAGNOSIS — E785 Hyperlipidemia, unspecified: Secondary | ICD-10-CM | POA: Diagnosis not present

## 2020-12-08 DIAGNOSIS — I10 Essential (primary) hypertension: Secondary | ICD-10-CM | POA: Diagnosis not present

## 2020-12-13 DIAGNOSIS — E785 Hyperlipidemia, unspecified: Secondary | ICD-10-CM | POA: Diagnosis not present

## 2020-12-13 DIAGNOSIS — R7309 Other abnormal glucose: Secondary | ICD-10-CM | POA: Diagnosis not present

## 2020-12-13 DIAGNOSIS — E039 Hypothyroidism, unspecified: Secondary | ICD-10-CM | POA: Diagnosis not present

## 2020-12-13 DIAGNOSIS — Z0000 Encounter for general adult medical examination without abnormal findings: Secondary | ICD-10-CM | POA: Diagnosis not present

## 2020-12-13 DIAGNOSIS — I129 Hypertensive chronic kidney disease with stage 1 through stage 4 chronic kidney disease, or unspecified chronic kidney disease: Secondary | ICD-10-CM | POA: Diagnosis not present

## 2020-12-13 DIAGNOSIS — E559 Vitamin D deficiency, unspecified: Secondary | ICD-10-CM | POA: Diagnosis not present

## 2020-12-20 DIAGNOSIS — Z0000 Encounter for general adult medical examination without abnormal findings: Secondary | ICD-10-CM | POA: Diagnosis not present

## 2020-12-20 DIAGNOSIS — R413 Other amnesia: Secondary | ICD-10-CM | POA: Diagnosis not present

## 2020-12-20 DIAGNOSIS — R7309 Other abnormal glucose: Secondary | ICD-10-CM | POA: Diagnosis not present

## 2020-12-20 DIAGNOSIS — I129 Hypertensive chronic kidney disease with stage 1 through stage 4 chronic kidney disease, or unspecified chronic kidney disease: Secondary | ICD-10-CM | POA: Diagnosis not present

## 2021-03-10 DIAGNOSIS — I10 Essential (primary) hypertension: Secondary | ICD-10-CM | POA: Diagnosis not present

## 2021-03-10 DIAGNOSIS — E785 Hyperlipidemia, unspecified: Secondary | ICD-10-CM | POA: Diagnosis not present

## 2021-03-10 DIAGNOSIS — E039 Hypothyroidism, unspecified: Secondary | ICD-10-CM | POA: Diagnosis not present

## 2021-03-21 DIAGNOSIS — Z1231 Encounter for screening mammogram for malignant neoplasm of breast: Secondary | ICD-10-CM | POA: Diagnosis not present

## 2021-04-16 DIAGNOSIS — Z0000 Encounter for general adult medical examination without abnormal findings: Secondary | ICD-10-CM | POA: Diagnosis not present

## 2021-04-16 DIAGNOSIS — R413 Other amnesia: Secondary | ICD-10-CM | POA: Diagnosis not present

## 2021-04-27 DIAGNOSIS — R410 Disorientation, unspecified: Secondary | ICD-10-CM | POA: Diagnosis not present

## 2021-04-28 DIAGNOSIS — Z20822 Contact with and (suspected) exposure to covid-19: Secondary | ICD-10-CM | POA: Diagnosis not present

## 2021-04-28 DIAGNOSIS — E860 Dehydration: Secondary | ICD-10-CM | POA: Diagnosis not present

## 2021-04-28 DIAGNOSIS — R079 Chest pain, unspecified: Secondary | ICD-10-CM | POA: Diagnosis not present

## 2021-05-03 DIAGNOSIS — R21 Rash and other nonspecific skin eruption: Secondary | ICD-10-CM | POA: Diagnosis not present

## 2021-05-03 DIAGNOSIS — E785 Hyperlipidemia, unspecified: Secondary | ICD-10-CM | POA: Diagnosis not present

## 2021-05-03 DIAGNOSIS — E039 Hypothyroidism, unspecified: Secondary | ICD-10-CM | POA: Diagnosis not present

## 2021-05-03 DIAGNOSIS — Z1389 Encounter for screening for other disorder: Secondary | ICD-10-CM | POA: Diagnosis not present

## 2021-05-03 DIAGNOSIS — I10 Essential (primary) hypertension: Secondary | ICD-10-CM | POA: Diagnosis not present

## 2021-05-16 DIAGNOSIS — E785 Hyperlipidemia, unspecified: Secondary | ICD-10-CM | POA: Diagnosis not present

## 2021-05-16 DIAGNOSIS — E039 Hypothyroidism, unspecified: Secondary | ICD-10-CM | POA: Diagnosis not present

## 2021-05-23 DIAGNOSIS — E039 Hypothyroidism, unspecified: Secondary | ICD-10-CM | POA: Diagnosis not present

## 2021-05-23 DIAGNOSIS — R21 Rash and other nonspecific skin eruption: Secondary | ICD-10-CM | POA: Diagnosis not present

## 2021-05-29 DIAGNOSIS — W19XXXD Unspecified fall, subsequent encounter: Secondary | ICD-10-CM | POA: Diagnosis not present

## 2021-07-05 DIAGNOSIS — J069 Acute upper respiratory infection, unspecified: Secondary | ICD-10-CM | POA: Diagnosis not present

## 2021-07-05 DIAGNOSIS — B349 Viral infection, unspecified: Secondary | ICD-10-CM | POA: Diagnosis not present

## 2021-07-05 DIAGNOSIS — R0981 Nasal congestion: Secondary | ICD-10-CM | POA: Diagnosis not present
